# Patient Record
Sex: Male | Born: 1965 | Race: White | Hispanic: No | Marital: Married | Smoking: Never smoker
Health system: Southern US, Community
[De-identification: ages and names within clinical notes are randomized; demographics above are authoritative.]

## PROBLEM LIST (undated history)

## (undated) DIAGNOSIS — Z8679 Personal history of other diseases of the circulatory system: Secondary | ICD-10-CM

## (undated) DIAGNOSIS — K219 Gastro-esophageal reflux disease without esophagitis: Secondary | ICD-10-CM

## (undated) DIAGNOSIS — E782 Mixed hyperlipidemia: Secondary | ICD-10-CM

## (undated) DIAGNOSIS — G44219 Episodic tension-type headache, not intractable: Secondary | ICD-10-CM

## (undated) DIAGNOSIS — I10 Essential (primary) hypertension: Secondary | ICD-10-CM

## (undated) HISTORY — DX: Essential (primary) hypertension: I10

## (undated) HISTORY — DX: Gastro-esophageal reflux disease without esophagitis: K21.9

## (undated) HISTORY — DX: Personal history of other diseases of the circulatory system: Z86.79

## (undated) HISTORY — DX: Episodic tension-type headache, not intractable: G44.219

## (undated) HISTORY — DX: Mixed hyperlipidemia: E78.2

---

## 2011-01-17 DIAGNOSIS — I10 Essential (primary) hypertension: Secondary | ICD-10-CM | POA: Insufficient documentation

## 2016-11-09 DIAGNOSIS — H6982 Other specified disorders of Eustachian tube, left ear: Secondary | ICD-10-CM | POA: Insufficient documentation

## 2017-01-16 DIAGNOSIS — I4510 Unspecified right bundle-branch block: Secondary | ICD-10-CM

## 2017-01-16 HISTORY — DX: Unspecified right bundle-branch block: I45.10

## 2017-02-16 DIAGNOSIS — R42 Dizziness and giddiness: Secondary | ICD-10-CM | POA: Insufficient documentation

## 2017-04-25 DIAGNOSIS — K219 Gastro-esophageal reflux disease without esophagitis: Secondary | ICD-10-CM | POA: Insufficient documentation

## 2017-04-25 DIAGNOSIS — E782 Mixed hyperlipidemia: Secondary | ICD-10-CM | POA: Insufficient documentation

## 2017-06-20 DIAGNOSIS — I2510 Atherosclerotic heart disease of native coronary artery without angina pectoris: Secondary | ICD-10-CM | POA: Insufficient documentation

## 2017-06-22 DIAGNOSIS — Z789 Other specified health status: Secondary | ICD-10-CM | POA: Insufficient documentation

## 2017-06-26 DIAGNOSIS — I4510 Unspecified right bundle-branch block: Secondary | ICD-10-CM | POA: Insufficient documentation

## 2017-07-31 ENCOUNTER — Encounter: Payer: Self-pay | Admitting: Neurology

## 2017-09-26 NOTE — Progress Notes (Signed)
NEUROLOGY CONSULTATION NOTE  Barry Villarreal MRN: [id number] DOB: 1965-09-21  Referring provider: Tawanna Cooler, MD Primary care provider: No PCP  Reason for consult:  headaches  HISTORY OF PRESENT ILLNESS: Barry Villarreal is a 52 year old right-handed male who presents for headaches.  History supplemented by referring provider's note.  Onset of symptoms started in September 2018.  He was playing golf and while walking to his ball, he suddenly felt like he was going to pass out.  On average, it lasts for a brief moment and occurs on average 1 to 2 days a week.    He also has a headache starting in the back of his neck radiating to the occipital region, rarely to the front, moderate to severe intensity, non-throbbing squeezing.  They last all day and are daily.    They are not associated with nausea, vomiting, photophobia, phonophobia, osmophobia, autonomic symptoms or unilateral numbness or weakness.  They are not aggravating by anything.  Aleve or Excedrin Tension sometimes helps.  He takes Aleve daily.  He also reports episodes of left facial numbness and tingling associated with slowed speech.  It is not associated with headache, nausea, vomiting, photophobia, phonophobia, osmophobia, visual disturbance, autonomic symptoms or unilateral numbness or weakness  It typically lasts less than 30 minutes.  It typically occurs once a week.    He denies shooting pain down the left arm, but feels his left arm may be weak.  He denies history of headaches and migraines.   Caffeine:  2 cups coffee daily.  For a period of time, he stopped and nothing changed. Exercise:  routine Depression:  No; Anxiety:  No Sleep:  Good. No family history of headaches or other neurologic conditions.   PAST MEDICAL HISTORY: No past medical history on file.  PAST SURGICAL HISTORY: No past surgical history of file.  MEDICATIONS: No current outpatient medications on file prior to visit.   No current  facility-administered medications on file prior to visit.     ALLERGIES: Advil PCN Sulfa  FAMILY HISTORY: No family history on file.  SOCIAL HISTORY: Social History   Socioeconomic History  . Marital status: Married    Spouse name: Not on file  . Number of children: Not on file  . Years of education: Not on file  . Highest education level: Not on file  Occupational History  . Not on file  Social Needs  . Financial resource strain: Not on file  . Food insecurity:    Worry: Not on file    Inability: Not on file  . Transportation needs:    Medical: Not on file    Non-medical: Not on file  Tobacco Use  . Smoking status: Not on file  Substance and Sexual Activity  . Alcohol use: Not on file  . Drug use: Not on file  . Sexual activity: Not on file  Lifestyle  . Physical activity:    Days per week: Not on file    Minutes per session: Not on file  . Stress: Not on file  Relationships  . Social connections:    Talks on phone: Not on file    Gets together: Not on file    Attends religious service: Not on file    Active member of club or organization: Not on file    Attends meetings of clubs or organizations: Not on file    Relationship status: Not on file  . Intimate partner violence:    Fear of current or ex  partner: Not on file    Emotionally abused: Not on file    Physically abused: Not on file    Forced sexual activity: Not on file  Other Topics Concern  . Not on file  Social History Narrative  . Not on file    REVIEW OF SYSTEMS: Constitutional: No fevers, chills, or sweats, no generalized fatigue, change in appetite Eyes: No visual changes, double vision, eye pain Ear, nose and throat: No hearing loss, ear pain, nasal congestion, sore throat Cardiovascular: No chest pain, palpitations Respiratory:  No shortness of breath at rest or with exertion, wheezes GastrointestinaI: No nausea, vomiting, diarrhea, abdominal pain, fecal incontinence Genitourinary:  No  dysuria, urinary retention or frequency Musculoskeletal:  No neck pain, back pain Integumentary: No rash, pruritus, skin lesions Neurological: as above Psychiatric: No depression, insomnia, anxiety Endocrine: No palpitations, fatigue, diaphoresis, mood swings, change in appetite, change in weight, increased thirst Hematologic/Lymphatic:  No purpura, petechiae. Allergic/Immunologic: no itchy/runny eyes, nasal congestion, recent allergic reactions, rashes  PHYSICAL EXAM: Blood pressure 112/74, pulse 67, height 5\' 10"  (1.778 m), weight 208 lb (94.3 kg), SpO2 99 %. General: No acute distress.  Patient appears well-groomed.   Head:  Normocephalic/atraumatic Eyes:  fundi examined but not visualized Neck: supple, left sided tenderness, full range of motion Back: No paraspinal tenderness Heart: regular rate and rhythm Lungs: Clear to auscultation bilaterally. Vascular: No carotid bruits. Neurological Exam: Mental status: alert and oriented to person, place, and time, recent and remote memory intact, fund of knowledge intact, attention and concentration intact, speech fluent and not dysarthric, language intact. Cranial nerves: CN I: not tested CN II: pupils equal, round and reactive to light, visual fields intact CN III, IV, VI:  full range of motion, no nystagmus, no ptosis CN V: facial sensation intact CN VII: upper and lower face symmetric CN VIII: hearing intact CN IX, X: gag intact, uvula midline CN XI: sternocleidomastoid and trapezius muscles intact CN XII: tongue midline Bulk & Tone: normal, no fasciculations. Motor:  5/5 throughout  Sensation:  Pinprick and vibration sensation intact. Deep Tendon Reflexes:  2+ throughout, toes downgoing.   Finger to nose testing:  Without dysmetria.  Heel to shin:  Without dysmetria.   Gait:  Normal station and stride.  Able to turn and tandem walk. Romberg negative  IMPRESSION: 1.  New onset headaches.  Semiology of headaches appear  consistent with tension-type headache complicated by medication-overuse.  I have no immediate explanation for the intermittent left facial numbness with slowed speech.  It could be migraine, which may suggest the headaches are migraine as well.  I do not suspect seizure.  She reports subjective left arm weakness, not appreciated on my exam.   2.  Near-syncope.  Unclear etiology.  At this point, it does not appear to be neurologic.  Cardiac workup reportedly negative.  He has been diagnosed with eustachian tube dysfunction but that wouldn't cause sensation of going to pass out.  PLAN: 1.  We will start nortriptyline 10mg  at bedtime.  If headaches not improved in 4 weeks, he may contact us and we can increase dose to 25mg  at bedtime 2.  Limit use of pain relievers to no more than 2 days out of week to prevent risk of rebound or medication-overuse headache. 3.  Keep headache diary 4.  Once you know that you are tolerating the nortriptyline (not causing too much drowsiness the next day), then try taking tizanidine 2mg  at bedtime to help with neck stiffness. 5.  We will obtain MRI brain report from your previous neurologist.  In the meantime, try to get a copy of the imaging on CD and bring it to me so that I may review it. 6.  Follow up in 3 to 4 months but remember to contact me in 4 weeks if you think we need to adjust the dose of medication.  Thank you for allowing me to take part in the care of this patient.  45 minutes spent face to face with patient, over 50% spent discussing diagnosis and management.  Shon Millet, DO  CC: Tawanna Cooler, MD

## 2017-09-27 ENCOUNTER — Encounter

## 2017-09-27 ENCOUNTER — Ambulatory Visit: Payer: 59 | Admitting: Neurology

## 2017-09-27 ENCOUNTER — Encounter: Payer: Self-pay | Admitting: Neurology

## 2017-09-27 VITALS — BP 112/74 | HR 67 | Ht 70.0 in | Wt 208.0 lb

## 2017-09-27 DIAGNOSIS — G44229 Chronic tension-type headache, not intractable: Secondary | ICD-10-CM | POA: Diagnosis not present

## 2017-09-27 DIAGNOSIS — R200 Anesthesia of skin: Secondary | ICD-10-CM

## 2017-09-27 DIAGNOSIS — R55 Syncope and collapse: Secondary | ICD-10-CM | POA: Diagnosis not present

## 2017-09-27 MED ORDER — NORTRIPTYLINE HCL 10 MG PO CAPS: 10.0000 mg | ORAL_CAPSULE | Freq: Every day | ORAL | 3 refills | Status: DC

## 2017-09-27 MED ORDER — TIZANIDINE HCL 2 MG PO TABS: 2.0000 mg | ORAL_TABLET | Freq: Every day | ORAL | 3 refills | Status: DC

## 2017-09-27 NOTE — Patient Instructions (Addendum)
The symptoms may be explained by migraines.  We will first treat for headaches/migraines and see if symptoms improve. 1.  Start nortriptyline 10mg  at bedtime.  If headaches not improved in 4 weeks, contact me and we can increase dose. 2.  Once you know that you are tolerating the nortriptyline (not causing too much drowsiness the next day), then try taking tizanidine 2mg  at bedtime to help with neck stiffness. 3.  Limit use of Aleve or Excedrin to no more than 2 days out of week to prevent risk of rebound or medication-overuse headache. 4.  Keep headache diary 5.  We will obtain MRI brain report from your previous neurologist.  In the meantime, try to get a copy of the imaging on CD and bring it to me so that I may review it. 6.  Follow up in 3 to 4 months but remember to contact me in 4 weeks if you think we need to adjust the dose of medication.

## 2017-10-08 ENCOUNTER — Telehealth: Payer: Self-pay | Admitting: Neurology

## 2017-10-08 NOTE — Telephone Encounter (Signed)
Faxed info

## 2017-10-08 NOTE — Telephone Encounter (Signed)
Dr. Nelida MeuseNicholas Stowell's office called needing this patient's office note 09/27/2017  faxed to them at [phone]. Thanks

## 2018-01-17 NOTE — Progress Notes (Signed)
NEUROLOGY FOLLOW UP OFFICE NOTE  Barry SalesSteven Fuhrer [id number]  HISTORY OF PRESENT ILLNESS: Barry Villarreal is a 53 year old right-handed male who follows up for tension type headache.  UPDATE: Headaches have resolved.  No longer has the facial numbness.  Rarely with dizziness.  Occasional blurred vision.  Still with posterior neck tightness radiating into the shoulders. Current NSAIDS: Aleve Current analgesics: Excedrin Current triptans:  none Current ergotamine:  none Current anti-emetic:  none Current muscle relaxants: Tizanidine 2 mg at bedtime Current anti-anxiolytic:  none Current sleep aide:  none Current Antihypertensive medications:  lisinopril Current Antidepressant medications: Nortriptyline 10 mg at bedtime Current Anticonvulsant medications:  none Current anti-CGRP:  none Current Vitamins/Herbal/Supplements:  D Current Antihistamines/Decongestants:  none Other therapy:  none  Caffeine: 2 cups of coffee daily Diet:  hydrates Exercise: Routine Depression: No; Anxiety: No Other pain: No Sleep hygiene: Good  HISTORY:  Onset of symptoms started in September 2018. He was playing golf and while walking to his ball, he suddenly felt like he was going to pass out.  On average, it lasts for a brief moment and occurs on average 1 to 2 days a week.    He also has a headache starting in the back of his neck radiating to the occipital region, rarely to the front, moderate to severe intensity, non-throbbing squeezing.  They last all day and are daily.      They are not associated with nausea, vomiting, photophobia, phonophobia, osmophobia, autonomic symptoms or unilateral numbness or weakness.    They are not aggravated by anything.  Aleve or Excedrin tension sometimes helps.  He has previously tried massage therapy and seen a chiropractor but uncertain if helpful because he was preoccupied by the headaches at the time.  He also reports episodes of left facial numbness and  tingling associated with slowed speech.  It is not associated with headache, nausea, vomiting, photophobia, phonophobia, osmophobia, visual disturbance, autonomic symptoms or unilateral numbness or weakness  It typically lasts less than 30 minutes.  It typically occurs once a week.    He denies shooting pain down the left arm, but feels his left arm may be weak.  He denies history of headaches and migraines.   Caffeine:  2 cups coffee daily.  For a period of time, he stopped and nothing changed. Exercise:  routine Depression:  No; Anxiety:  No Sleep:  Good. No family history of headaches or other neurologic conditions.  PAST MEDICAL HISTORY: Hypertension Hyperlipidemia  MEDICATIONS: Current Outpatient Medications on File Prior to Visit  Medication Sig Dispense Refill  . cholecalciferol (VITAMIN D) 1000 units tablet Take 1,000 Units by mouth daily.    Marland Kitchen. lisinopril (PRINIVIL,ZESTRIL) 20 MG tablet Take 20 mg by mouth daily.  3  . nortriptyline (PAMELOR) 10 MG capsule Take 1 capsule (10 mg total) by mouth at bedtime. 30 capsule 3  . REPATHA 140 MG/ML SOSY Inject 140 mg into the skin every 30 (thirty) days.    Marland Kitchen. tiZANidine (ZANAFLEX) 2 MG tablet Take 1 tablet (2 mg total) by mouth at bedtime. 30 tablet 3   No current facility-administered medications on file prior to visit.     ALLERGIES: Not on File  FAMILY HISTORY: Family History  Problem Relation Age of Onset  . Bipolar disorder Mother   . Breast cancer Mother    SOCIAL HISTORY: Social History   Socioeconomic History  . Marital status: Married    Spouse name: angie  . Number of children: Not on file  .  Years of education: Not on file  . Highest education level: Associate degree: academic program  Occupational History  . Occupation: Investment banker, corporate: [employer]  Social Needs  . Financial resource strain: Not on file  . Food insecurity:    Worry: Not on file    Inability: Not on file  . Transportation  needs:    Medical: Not on file    Non-medical: Not on file  Tobacco Use  . Smoking status: Never Smoker  . Smokeless tobacco: Never Used  Substance and Sexual Activity  . Alcohol use: Yes    Comment: social  . Drug use: Never  . Sexual activity: Not on file  Lifestyle  . Physical activity:    Days per week: Not on file    Minutes per session: Not on file  . Stress: Not on file  Relationships  . Social connections:    Talks on phone: Not on file    Gets together: Not on file    Attends religious service: Not on file    Active member of club or organization: Not on file    Attends meetings of clubs or organizations: Not on file    Relationship status: Not on file  . Intimate partner violence:    Fear of current or ex partner: Not on file    Emotionally abused: Not on file    Physically abused: Not on file    Forced sexual activity: Not on file  Other Topics Concern  . Not on file  Social History Narrative   Patient is right-handed. He lives with his wife in a 2 story house. He drinks 1-2 cups of coffee a day, and rarely tea or soda.    REVIEW OF SYSTEMS: Constitutional: No fevers, chills, or sweats, no generalized fatigue, change in appetite Eyes: No visual changes, double vision, eye pain Ear, nose and throat: No hearing loss, ear pain, nasal congestion, sore throat Cardiovascular: No chest pain, palpitations Respiratory:  No shortness of breath at rest or with exertion, wheezes GastrointestinaI: No nausea, vomiting, diarrhea, abdominal pain, fecal incontinence Genitourinary:  No dysuria, urinary retention or frequency Musculoskeletal:  No neck pain, back pain Integumentary: No rash, pruritus, skin lesions Neurological: as above Psychiatric: No depression, insomnia, anxiety Endocrine: No palpitations, fatigue, diaphoresis, mood swings, change in appetite, change in weight, increased thirst Hematologic/Lymphatic:  No purpura, petechiae. Allergic/Immunologic: no  itchy/runny eyes, nasal congestion, recent allergic reactions, rashes  PHYSICAL EXAM: Blood pressure 122/68, pulse 86, height 5\' 10"  (1.778 m), weight 210 lb (95.3 kg), SpO2 97 %. General: No acute distress.  Patient appears well-groomed.   Head:  Normocephalic/atraumatic Eyes:  Fundi examined but not visualized Neck: supple, no paraspinal tenderness, full range of motion Back: No paraspinal tenderness Neurological Exam: alert and oriented to person, place, and time. Attention span and concentration intact, recent and remote memory intact, fund of knowledge intact.  Speech fluent and not dysarthric, language intact.  CN II-XII intact. Bulk and tone normal, muscle strength 5/5 throughout.  Sensation to light touch intact.  Deep tendon reflexes 2+ throughout, toes downgoing.  Finger to nose and heel to shin testing intact.  Gait normal, Romberg negative.  IMPRESSION: 1.  Cervicalgia, myofascial 2.  Tension-type headache, resolved 3.  Recurrent transient spells presenting as left facial numbness and slurred speech, resolved.  Differential diagnosis may include complicated migraine without headache.  PLAN: 1.  For preventative management, continue nortriptyline 10mg  at bedtime 2. For myofascial neck pain, increase  tizanidine to 4mg  at bedtime.  If ineffective, consider referral to physical therapy 3.  Limit use of pain relievers to no more than 2 days out of week to prevent risk of rebound or medication-overuse headache. 4.  Keep headache diary 5.  Exercise, hydration, caffeine cessation, sleep hygiene, monitor for and avoid triggers 6.  Consider:  magnesium citrate 400mg  daily, riboflavin 400mg  daily, and coenzyme Q10 100mg  three times daily 7.  Follow up in 6 months.  Shon MilletAdam Jaffe, DO

## 2018-01-18 ENCOUNTER — Ambulatory Visit: Payer: 59 | Admitting: Neurology

## 2018-01-18 ENCOUNTER — Encounter: Payer: Self-pay | Admitting: Neurology

## 2018-01-18 VITALS — BP 122/68 | HR 86 | Ht 70.0 in | Wt 210.0 lb

## 2018-01-18 DIAGNOSIS — R200 Anesthesia of skin: Secondary | ICD-10-CM | POA: Diagnosis not present

## 2018-01-18 DIAGNOSIS — M542 Cervicalgia: Secondary | ICD-10-CM

## 2018-01-18 DIAGNOSIS — G44229 Chronic tension-type headache, not intractable: Secondary | ICD-10-CM

## 2018-01-18 MED ORDER — TIZANIDINE HCL 4 MG PO TABS: 4.0000 mg | ORAL_TABLET | Freq: Every day | ORAL | 5 refills | Status: DC

## 2018-01-18 NOTE — Patient Instructions (Signed)
1.  Increase tizanidine to 4mg  at bedtime 2.  Continue nortriptyline 10mg  at bedtime. 3.  If neck tension continues, contact me and I can refer you to a sports medicine doctor 4.  Follow up in 6 months.

## 2018-01-20 ENCOUNTER — Other Ambulatory Visit: Payer: Self-pay | Admitting: Neurology

## 2018-07-10 ENCOUNTER — Other Ambulatory Visit: Payer: Self-pay | Admitting: Neurology

## 2018-07-21 ENCOUNTER — Other Ambulatory Visit: Payer: Self-pay | Admitting: Neurology

## 2018-07-31 NOTE — Progress Notes (Signed)
Virtual Visit via Video Note The purpose of this virtual visit is to provide medical care while limiting exposure to the novel coronavirus.    Consent was obtained for video visit:  Yes.   Answered questions that patient had about telehealth interaction:  Yes.   I discussed the limitations, risks, security and privacy concerns of performing an evaluation and management service by telemedicine. I also discussed with the patient that there may be a patient responsible charge related to this service. The patient expressed understanding and agreed to proceed.  Pt location: Home Physician Location: office Name of referring provider:  No ref. provider found I connected with Barry Villarreal at patients initiation/request on 08/01/2018 at  3:30 PM EDT by video enabled telemedicine application and verified that I am speaking with the correct person using two identifiers. Pt MRN:  [id number] Pt DOB:  1965/09/20 Video Participants:  Barry Villarreal   History of Present Illness:  Barry Villarreal is a 53 year old right-handed male who follows up for tension type headache.  UPDATE: Dull-moderate headaches last all day but may get one no more than once or twice a week.  No longer has the facial numbness.  Rarely with dizziness, particularly if he gets up to walk, bends over or turns quickly.  Occasional blurred vision.  Still with posterior neck tightness radiating into the shoulders. Current NSAIDS: Aleve Current analgesics: Excedrin Current triptans:  none Current ergotamine:  none Current anti-emetic:  none Current muscle relaxants: Tizanidine 4 mg in evening Current anti-anxiolytic:  none Current sleep aide:  none Current Antihypertensive medications:  lisinopril Current Antidepressant medications: Nortriptyline 10 mg at bedtime Current Anticonvulsant medications:  none Current anti-CGRP:  none Current Vitamins/Herbal/Supplements:  D Current Antihistamines/Decongestants:  none Other  therapy:  none  Caffeine: 2 cups of coffee daily Diet:  hydrates Exercise: Routine Depression: No; Anxiety: No Other pain: No Sleep hygiene: Good  HISTORY:  Onset of symptoms started in September 2018.He was playing golf and while walking to his ball, he suddenly felt like he was going to pass out. On average, it lasts for a brief moment and occurs on average 1 to 2 days a week.   He also has a headache starting in the back of his neck radiating to the occipital region, rarely to the front, moderate to severe intensity, non-throbbing squeezing. They last all day and are daily.   They are not associated with nausea, vomiting, photophobia, phonophobia, osmophobia, autonomic symptoms or unilateral numbness or weakness.   They are not aggravated by anything.  Aleve or Excedrin tension sometimes helps.  He has previously tried massage therapy and seen a chiropractor but uncertain if helpful because he was preoccupied by the headaches at the time.  He also reports episodes of left facial numbness and tinglingassociated with slowed speech. It is notassociated with headache, nausea, vomiting, photophobia, phonophobia, osmophobia, visual disturbance, autonomic symptoms or unilateral numbness or weaknessIt typically lasts less than 30 minutes. It typically occurs once a week.   He denies shooting pain down the left arm, but feels his left arm may be weak.  He denies history of headaches and migraines.  Caffeine: 2 cups coffee daily. For a period of time, he stopped and nothing changed. Exercise: routine Depression: No; Anxiety: No Sleep: Good. No family history of headaches or other neurologic conditions.  Past Medical History: No past medical history on file.  Medications: Outpatient Encounter Medications as of 08/01/2018  Medication Sig  . cholecalciferol (VITAMIN D) 1000 units tablet Take  1,000 Units by mouth daily.  Marland Kitchen. lisinopril (PRINIVIL,ZESTRIL) 20 MG tablet  Take 20 mg by mouth daily.  . nortriptyline (PAMELOR) 10 MG capsule TAKE 1 CAPSULE(10 MG) BY MOUTH AT BEDTIME  . REPATHA 140 MG/ML SOSY Inject 140 mg into the skin every 30 (thirty) days.  Marland Kitchen. tiZANidine (ZANAFLEX) 4 MG tablet TAKE 1 TABLET(4 MG) BY MOUTH AT BEDTIME   No facility-administered encounter medications on file as of 08/01/2018.     Allergies: Not on File  Family History: Family History  Problem Relation Age of Onset  . Bipolar disorder Mother   . Breast cancer Mother     Social History: Social History   Socioeconomic History  . Marital status: Married    Spouse name: angie  . Number of children: Not on file  . Years of education: Not on file  . Highest education level: Associate degree: academic program  Occupational History  . Occupation: Investment banker, corporateales    Employer: [employer]  Social Needs  . Financial resource strain: Not on file  . Food insecurity    Worry: Not on file    Inability: Not on file  . Transportation needs    Medical: Not on file    Non-medical: Not on file  Tobacco Use  . Smoking status: Never Smoker  . Smokeless tobacco: Never Used  Substance and Sexual Activity  . Alcohol use: Yes    Comment: social  . Drug use: Never  . Sexual activity: Not on file  Lifestyle  . Physical activity    Days per week: Not on file    Minutes per session: Not on file  . Stress: Not on file  Relationships  . Social Musicianconnections    Talks on phone: Not on file    Gets together: Not on file    Attends religious service: Not on file    Active member of club or organization: Not on file    Attends meetings of clubs or organizations: Not on file    Relationship status: Not on file  . Intimate partner violence    Fear of current or ex partner: Not on file    Emotionally abused: Not on file    Physically abused: Not on file    Forced sexual activity: Not on file  Other Topics Concern  . Not on file  Social History Narrative   Patient is right-handed. He  lives with his wife in a 2 story house. He drinks 1-2 cups of coffee a day, and rarely tea or soda.    Observations/Objective:   Height 5\' 11"  (1.803 m), weight 200 lb (90.7 kg). No acute distress.  Alert and oriented.  Speech fluent and not dysarthric.  Language intact.  Eyes orthophoric on primary gaze.  Face symmetric.  Assessment and Plan:   1.  Cervicalgia, myofascial 2.  Tension-type headache, resolved 3.  Recurrent transient spells presenting as left facial numbness and slurred speech, resolved.  Differential diagnosis may include complicated migraine without headache.  1.  For preventative management, increase nortriptyline to 25mg  at bedtime 2.  For cervicalgia, will refer to Sports Medicine for [facility].  Continue tizanidine 4mg  in the evening. 3.  Limit use of pain relievers to no more than 2 days out of week to prevent risk of rebound or medication-overuse headache. 4.  Keep headache diary 5.  Exercise, hydration, caffeine cessation, sleep hygiene, monitor for and avoid triggers 6.  Consider:  magnesium citrate 400mg  daily, riboflavin 400mg  daily, and coenzyme Q10  100mg  three times daily 7. Always keep in mind that currently taking a hormone or birth control may be a possible trigger or aggravating factor for migraine. 8. Follow up in 6 months.  Follow Up Instructions:    -I discussed the assessment and treatment plan with the patient. The patient was provided an opportunity to ask questions and all were answered. The patient agreed with the plan and demonstrated an understanding of the instructions.   The patient was advised to call back or seek an in-person evaluation if the symptoms worsen or if the condition fails to improve as anticipated.   Cira ServantAdam Robert Andreanna Mikolajczak, DO

## 2018-08-01 ENCOUNTER — Other Ambulatory Visit: Payer: Self-pay

## 2018-08-01 ENCOUNTER — Telehealth (INDEPENDENT_AMBULATORY_CARE_PROVIDER_SITE_OTHER): Payer: 59 | Admitting: Neurology

## 2018-08-01 ENCOUNTER — Encounter: Payer: Self-pay | Admitting: Neurology

## 2018-08-01 VITALS — Ht 71.0 in | Wt 200.0 lb

## 2018-08-01 DIAGNOSIS — G44219 Episodic tension-type headache, not intractable: Secondary | ICD-10-CM

## 2018-08-01 DIAGNOSIS — M542 Cervicalgia: Secondary | ICD-10-CM

## 2018-08-01 MED ORDER — NORTRIPTYLINE HCL 25 MG PO CAPS: 25.0000 mg | ORAL_CAPSULE | Freq: Every day | ORAL | 3 refills | Status: DC

## 2018-08-01 NOTE — Addendum Note (Signed)
Addended by: Clois Comber on: 08/01/2018 03:50 PM   Modules accepted: Orders

## 2018-10-17 DIAGNOSIS — Z8616 Personal history of COVID-19: Secondary | ICD-10-CM

## 2018-10-17 HISTORY — DX: Personal history of COVID-19: Z86.16

## 2018-11-24 ENCOUNTER — Other Ambulatory Visit: Payer: Self-pay | Admitting: Neurology

## 2019-01-16 ENCOUNTER — Other Ambulatory Visit: Payer: Self-pay | Admitting: Neurology

## 2019-02-03 NOTE — Progress Notes (Signed)
Virtual Visit via Video Note The purpose of this virtual visit is to provide medical care while limiting exposure to the novel coronavirus.    Consent was obtained for video visit:  Yes.   Answered questions that patient had about telehealth interaction:  Yes.   I discussed the limitations, risks, security and privacy concerns of performing an evaluation and management service by telemedicine. I also discussed with the patient that there may be a patient responsible charge related to this service. The patient expressed understanding and agreed to proceed.  Pt location: Home Physician Location: office Name of referring provider:  No ref. provider found I connected with Barry Villarreal at patients initiation/request on 02/04/2019 at  3:30 PM EST by video enabled telemedicine application and verified that I am speaking with the correct person using two identifiers. Pt MRN:  [id number] Pt DOB:  17-Mar-1965 Video Participants:  Barry Villarreal   History of Present Illness:  Barry Villarreal is a 54 year old right-handed male who follows up for tension type headache.  UPDATE: Nortriptyline was increased to 25mg  in July.  He has not had any headaches in a few months.  He had Covid in October.  For the past 3 months, he and his wife noted some changes.  He sometimes gets words mixed up.  For example, he may say hot when he meant to say cold.  Also, if he has 3 or 4 paragraphs to read, he has now found himself reading the last paragraph and then working up to the first paragraph.  He does not know why.  He doesn't have any difficulty understanding what he reads or what other people are saying to him.  People do not have difficulty understanding what he is saying.  Current NSAIDS:Aleve Current analgesics:Excedrin Current triptans:none Current ergotamine:none Current anti-emetic:none Current muscle relaxants:Tizanidine 4 mg in evening Current anti-anxiolytic:none Current sleep  aide:none Current Antihypertensive medications:lisinopril Current Antidepressant medications:Nortriptyline 25 mg at bedtime Current Anticonvulsant medications:none Current anti-CGRP:none Current Vitamins/Herbal/Supplements:D Current Antihistamines/Decongestants:none Other therapy:none  Caffeine:2 cups of coffee daily Diet:hydrates Exercise:Routine Depression:No; Anxiety:No Other pain:No Sleep hygiene:Good  HISTORY: Onset of symptoms started in September 2018.He was playing golf and while walking to his ball, he suddenly felt like he was going to pass out. On average, it lasts for a brief moment and occurs on average 1 to 2 days a week.   He also has a headache starting in the back of his neck radiating to the occipital region, rarely to the front, moderate to severe intensity, non-throbbing squeezing. They last all day and are daily.They are not associated with nausea, vomiting, photophobia, phonophobia, osmophobia, autonomic symptoms or unilateral numbness or weakness.They are not aggravated by anything. Aleve or Excedrin tension sometimes helps. He has previously tried massage therapy and seen a chiropractor but uncertain if helpful because he was preoccupied by the headaches at the time.  He also reports episodes of left facial numbness and tinglingassociated with slowed speech. It is notassociated with headache, nausea, vomiting, photophobia, phonophobia, osmophobia, visual disturbance, autonomic symptoms or unilateral numbness or weaknessIt typically lasts less than 30 minutes. It typically occurs once a week.   He denies shooting pain down the left arm, but feels his left arm may be weak.  He denies history of headaches and migraines.  Caffeine: 2 cups coffee daily. For a period of time, he stopped and nothing changed. Exercise: routine Depression: No; Anxiety: No Sleep: Good. No family history of headaches or other  neurologic conditions.  Past Medical History: No  past medical history on file.  Medications: Outpatient Encounter Medications as of 02/04/2019  Medication Sig  . cholecalciferol (VITAMIN D) 1000 units tablet Take 1,000 Units by mouth daily.  . fluticasone (FLONASE) 50 MCG/ACT nasal spray   . LIPITOR 40 MG tablet Take 40 mg by mouth once a week.  Marland Kitchen lisinopril (PRINIVIL,ZESTRIL) 20 MG tablet Take 20 mg by mouth daily.  . nortriptyline (PAMELOR) 25 MG capsule TAKE 1 CAPSULE(25 MG) BY MOUTH AT BEDTIME  . omeprazole (PRILOSEC) 20 MG capsule Take 20 mg by mouth daily.  Marland Kitchen REPATHA 140 MG/ML SOSY Inject 140 mg into the skin every 30 (thirty) days.  Marland Kitchen tiZANidine (ZANAFLEX) 4 MG tablet TAKE 1 TABLET(4 MG) BY MOUTH AT BEDTIME   No facility-administered encounter medications on file as of 02/04/2019.    Allergies: Not on File  Family History: Family History  Problem Relation Age of Onset  . Bipolar disorder Mother   . Breast cancer Mother     Social History: Social History   Socioeconomic History  . Marital status: Married    Spouse name: Barry Villarreal  . Number of children: Not on file  . Years of education: Not on file  . Highest education level: Associate degree: academic program  Occupational History  . Occupation: Investment banker, corporate: [employer]  Tobacco Use  . Smoking status: Never Smoker  . Smokeless tobacco: Never Used  Substance and Sexual Activity  . Alcohol use: Yes    Comment: social  . Drug use: Never  . Sexual activity: Not on file  Other Topics Concern  . Not on file  Social History Narrative   Patient is right-handed. He lives with his wife in a 2 story house. He drinks 1-2 cups of coffee a day, and rarely tea or soda.   Social Determinants of Health   Financial Resource Strain:   . Difficulty of Paying Living Expenses: Not on file  Food Insecurity:   . Worried About Programme researcher, broadcasting/film/video in the Last Year: Not on file  . Ran Out of Food in the Last Year:  Not on file  Transportation Needs:   . Lack of Transportation (Medical): Not on file  . Lack of Transportation (Non-Medical): Not on file  Physical Activity:   . Days of Exercise per Week: Not on file  . Minutes of Exercise per Session: Not on file  Stress:   . Feeling of Stress : Not on file  Social Connections:   . Frequency of Communication with Friends and Family: Not on file  . Frequency of Social Gatherings with Friends and Family: Not on file  . Attends Religious Services: Not on file  . Active Member of Clubs or Organizations: Not on file  . Attends Banker Meetings: Not on file  . Marital Status: Not on file  Intimate Partner Violence:   . Fear of Current or Ex-Partner: Not on file  . Emotionally Abused: Not on file  . Physically Abused: Not on file  . Sexually Abused: Not on file    Observations/Objective:   Height 5\' 10"  (1.778 m), weight 200 lb (90.7 kg). No acute distress.  Alert and oriented.  Speech fluent and not dysarthric.  Language intact.  Eyes orthophoric on primary gaze.  Face symmetric.  Assessment and Plan:   1.  Paraphasic errors.  Unclear etiology.  Will further evaluate 2.  Tension-type headache, resolved 3.  Recurrent transient spells of left facial numbness and slurred speech, resolved.  Differential include complicated migraine without headache.  1.  To evaluate for language disorder, will check MRI of brain without contrast and neuropsychological testing. 2.  Continue nortriptyline 25mg  at bedtime. 3.  Follow up in 6 months (or sooner pending test results)  Follow Up Instructions:    -I discussed the assessment and treatment plan with the patient. The patient was provided an opportunity to ask questions and all were answered. The patient agreed with the plan and demonstrated an understanding of the instructions.   The patient was advised to call back or seek an in-person evaluation if the symptoms worsen or if the condition fails to  improve as anticipated.    Dudley Major, DO

## 2019-02-04 ENCOUNTER — Other Ambulatory Visit: Payer: Self-pay

## 2019-02-04 ENCOUNTER — Encounter: Payer: Self-pay | Admitting: Neurology

## 2019-02-04 ENCOUNTER — Telehealth (INDEPENDENT_AMBULATORY_CARE_PROVIDER_SITE_OTHER): Payer: 59 | Admitting: Neurology

## 2019-02-04 VITALS — Ht 70.0 in | Wt 200.0 lb

## 2019-02-04 DIAGNOSIS — F809 Developmental disorder of speech and language, unspecified: Secondary | ICD-10-CM

## 2019-02-04 DIAGNOSIS — R200 Anesthesia of skin: Secondary | ICD-10-CM

## 2019-02-04 DIAGNOSIS — G44219 Episodic tension-type headache, not intractable: Secondary | ICD-10-CM

## 2019-03-05 ENCOUNTER — Other Ambulatory Visit: Payer: Self-pay

## 2019-03-05 ENCOUNTER — Ambulatory Visit
Admission: RE | Admit: 2019-03-05 | Discharge: 2019-03-05 | Disposition: A | Discharge status: Home / Self Care | Payer: 59 | Source: Ambulatory Visit | Attending: Neurology | Admitting: Neurology

## 2019-03-05 DIAGNOSIS — R200 Anesthesia of skin: Secondary | ICD-10-CM

## 2019-03-05 DIAGNOSIS — F809 Developmental disorder of speech and language, unspecified: Secondary | ICD-10-CM

## 2019-04-19 ENCOUNTER — Other Ambulatory Visit: Payer: Self-pay | Admitting: Neurology

## 2019-07-07 ENCOUNTER — Ambulatory Visit: Payer: 59 | Admitting: Neurology

## 2019-07-08 ENCOUNTER — Encounter: Payer: 59 | Admitting: Psychology

## 2019-07-09 ENCOUNTER — Other Ambulatory Visit: Payer: Self-pay | Admitting: Neurology

## 2019-07-14 NOTE — Progress Notes (Signed)
NEUROLOGY FOLLOW UP OFFICE NOTE  Barry Villarreal [id number]  HISTORY OF PRESENT ILLNESS: Barry Villarreal is a 54 year old right-handed male who follows up for tension-type headache.  UPDATE: To evaluate for cause of language disorder, MRI of brain without contrast on 03/05/2019 was performed, which was normal.   Headaches improved.  Sometimes with tension in back of neck but not a headache.    When he gets up out of bed, he feels slightly dizzy and off balance for a couple of seconds.  Loses focus on what he wants to say.   Current NSAIDS:Aleve Current analgesics:Excedrin Current triptans:none Current ergotamine:none Current anti-emetic:none Current muscle relaxants:Tizanidine4mg  in evening Current anti-anxiolytic:none Current sleep aide:none Current Antihypertensive medications:lisinopril Current Antidepressant medications:Nortriptyline 25 mg at bedtime Current Anticonvulsant medications:none Current anti-CGRP:none Current Vitamins/Herbal/Supplements:D Current Antihistamines/Decongestants:none Other therapy:none  Caffeine:2 cups of coffee daily Diet:hydrates Exercise:Routine Depression:No; Anxiety:No Other pain:No Sleep hygiene:Good  HISTORY: Onset of symptoms started in September 2018.He was playing golf and while walking to his ball, he suddenly felt like he was going to pass out. On average, it lasts for a brief moment and occurs on average 1 to 2 days a week.   He also has a headache starting in the back of his neck radiating to the occipital region, rarely to the front, moderate to severe intensity, non-throbbing squeezing. They last all day and are daily.They are not associated with nausea, vomiting, photophobia, phonophobia, osmophobia, autonomic symptoms or unilateral numbness or weakness.They are not aggravated by anything. Aleve or Excedrin tension sometimes helps. He has previously tried massage therapy  and seen a chiropractor but uncertain if helpful because he was preoccupied by the headaches at the time.  He also reports episodes of left facial numbness and tinglingassociated with slowed speech. It is notassociated with headache, nausea, vomiting, photophobia, phonophobia, osmophobia, visual disturbance, autonomic symptoms or unilateral numbness or weaknessIt typically lasts less than 30 minutes. It typically occurs once a week.   Since 2020, he and his wife noted some changes.  He sometimes gets words mixed up.  For example, he may say hot when he meant to say cold.  Also, if he has 3 or 4 paragraphs to read, he has now found himself reading the last paragraph and then working up to the first paragraph.  He does not know why.  He doesn't have any difficulty understanding what he reads or what other people are saying to him.  People do not have difficulty understanding what he is saying.    He denies shooting pain down the left arm, but feels his left arm may be weak.  He denies history of headaches and migraines.  Caffeine: 2 cups coffee daily. For a period of time, he stopped and nothing changed. Exercise: routine Depression: No; Anxiety: No Sleep: Good. No family history of headaches or other neurologic conditions.  PAST MEDICAL HISTORY: No past medical history on file.  MEDICATIONS: Current Outpatient Medications on File Prior to Visit  Medication Sig Dispense Refill  . cholecalciferol (VITAMIN D) 1000 units tablet Take 1,000 Units by mouth daily.    . fluticasone (FLONASE) 50 MCG/ACT nasal spray     . lisinopril (PRINIVIL,ZESTRIL) 20 MG tablet Take 20 mg by mouth daily.  3  . Multiple Vitamins-Minerals (MULTIVITAMIN WITH MINERALS) tablet Take 1 tablet by mouth daily.    . nortriptyline (PAMELOR) 25 MG capsule TAKE 1 CAPSULE BY MOUTH EVERY NIGHT AT BEDTIME 30 capsule 3  . omeprazole (PRILOSEC) 20 MG capsule Take 20 mg by mouth  daily.    Marland Kitchen REPATHA 140 MG/ML SOSY  Inject 140 mg into the skin every 30 (thirty) days.    Marland Kitchen tiZANidine (ZANAFLEX) 4 MG tablet TAKE 1 TABLET(4 MG) BY MOUTH AT BEDTIME 30 tablet 5   No current facility-administered medications on file prior to visit.    ALLERGIES: Allergies  Allergen Reactions  . Penicillins Hives    FAMILY HISTORY: Family History  Problem Relation Age of Onset  . Bipolar disorder Mother   . Breast cancer Mother     SOCIAL HISTORY: Social History   Socioeconomic History  . Marital status: Married    Spouse name: angie  . Number of children: Not on file  . Years of education: Not on file  . Highest education level: Associate degree: academic program  Occupational History  . Occupation: Investment banker, corporate: [employer]  Tobacco Use  . Smoking status: Never Smoker  . Smokeless tobacco: Never Used  Substance and Sexual Activity  . Alcohol use: Yes    Comment: social  . Drug use: Never  . Sexual activity: Not on file  Other Topics Concern  . Not on file  Social History Narrative   Patient is right-handed. He lives with his wife in a 2 story house. He drinks 1-2 cups of coffee a day, and rarely tea or soda.   Social Determinants of Health   Financial Resource Strain:   . Difficulty of Paying Living Expenses:   Food Insecurity:   . Worried About Programme researcher, broadcasting/film/video in the Last Year:   . Barista in the Last Year:   Transportation Needs:   . Freight forwarder (Medical):   Marland Kitchen Lack of Transportation (Non-Medical):   Physical Activity:   . Days of Exercise per Week:   . Minutes of Exercise per Session:   Stress:   . Feeling of Stress :   Social Connections:   . Frequency of Communication with Friends and Family:   . Frequency of Social Gatherings with Friends and Family:   . Attends Religious Services:   . Active Member of Clubs or Organizations:   . Attends Banker Meetings:   Marland Kitchen Marital Status:   Intimate Partner Violence:   . Fear of Current or  Ex-Partner:   . Emotionally Abused:   Marland Kitchen Physically Abused:   . Sexually Abused:     PHYSICAL EXAM: Blood pressure 130/85, pulse 85, height 5\' 10"  (1.778 m), weight 198 lb 3.2 oz (89.9 kg), SpO2 100 %.  General: No acute distress.  Patient appears well-groomed.   Head:  Normocephalic/atraumatic Eyes:  Fundi examined but not visualized Neck: supple, no paraspinal tenderness, full range of motion Heart:  Regular rate and rhythm Lungs:  Clear to auscultation bilaterally Back: No paraspinal tenderness Neurological Exam: alert and oriented to person, place, and time. Attention span and concentration intact, recent and remote memory intact, fund of knowledge intact.  Speech fluent and not dysarthric, language intact.  CN II-XII intact. Bulk and tone normal, muscle strength 5/5 throughout.  Sensation to light touch, temperature and vibration intact.  Deep tendon reflexes 2+ throughout, toes downgoing.  Finger to nose and heel to shin testing intact.  Gait normal, Romberg negative.  IMPRESSION: 1.  Tension-type headache, not intractable. 2.  Paraphasic errors.  Unclear etiology.   3.  Recurrent transient spells of left facial numbness with slurred speech, resolved.    PLAN: 1.  Continue nortriptyline 2.  Has neuropsych evaluation  on 7/29 3.  Further recommendations pending results.  Otherwise, follow up in 9 months.  Shon Millet, DO

## 2019-07-15 ENCOUNTER — Encounter: Payer: 59 | Admitting: Psychology

## 2019-07-16 ENCOUNTER — Ambulatory Visit: Payer: 59 | Admitting: Neurology

## 2019-07-16 ENCOUNTER — Encounter: Payer: Self-pay | Admitting: Neurology

## 2019-07-16 ENCOUNTER — Other Ambulatory Visit: Payer: Self-pay

## 2019-07-16 VITALS — BP 130/85 | HR 85 | Ht 70.0 in | Wt 198.2 lb

## 2019-07-16 DIAGNOSIS — G44219 Episodic tension-type headache, not intractable: Secondary | ICD-10-CM

## 2019-07-16 DIAGNOSIS — F809 Developmental disorder of speech and language, unspecified: Secondary | ICD-10-CM | POA: Diagnosis not present

## 2019-07-16 NOTE — Patient Instructions (Signed)
Continue nortriptyline Follow up for cognitive testing with Dr. Milbert Coulter on 7/29.  Further recommendations pending results. Otherwise, follow up with me in 9 months.

## 2019-08-11 ENCOUNTER — Other Ambulatory Visit: Payer: Self-pay | Admitting: Neurology

## 2019-08-14 ENCOUNTER — Ambulatory Visit (INDEPENDENT_AMBULATORY_CARE_PROVIDER_SITE_OTHER): Payer: 59 | Admitting: Psychology

## 2019-08-14 ENCOUNTER — Encounter: Payer: Self-pay | Admitting: Psychology

## 2019-08-14 ENCOUNTER — Other Ambulatory Visit: Payer: Self-pay

## 2019-08-14 ENCOUNTER — Ambulatory Visit: Payer: 59 | Admitting: Psychology

## 2019-08-14 DIAGNOSIS — R4189 Other symptoms and signs involving cognitive functions and awareness: Secondary | ICD-10-CM

## 2019-08-14 DIAGNOSIS — G44219 Episodic tension-type headache, not intractable: Secondary | ICD-10-CM

## 2019-08-14 DIAGNOSIS — Z8679 Personal history of other diseases of the circulatory system: Secondary | ICD-10-CM | POA: Insufficient documentation

## 2019-08-14 NOTE — Progress Notes (Signed)
NEUROPSYCHOLOGICAL EVALUATION [place] Department of Neurology  Date of Evaluation: August 14, 2019  Reason for Referral:   Thomasenia SalesSteven Cervantes is a 54 y.o. right-handed Caucasian male referred by Shon MilletAdam Jaffe, D.O., to characterize his current cognitive functioning and assist with diagnostic clarity and treatment planning in the context of subjective cognitive decline primarily surrounding expressive language.   Assessment and Plan:   Clinical Impression(s): Mr. Leda QuailHartsell's pattern of performance is suggestive of neuropsychological functioning largely within normal limits. An isolated weakness was exhibited recalling a previously learned list of words after a lengthy delay (retention percentage: 0%). However, delayed recall and retention percentages across three other verbal and visual memory tasks were appropriate. Thus, this weakness is not cause for concern in isolation. Performance variability was further noted across recognition aspects of memory measures, with scores ranging from the well below average to above average normative ranges. Outside of these results, performances across all other assessed cognitive domains (i.e., processing speed, attention/concentration, executive functioning, receptive and expressive language, visuospatial abilities, and encoding aspects of memory) were well within appropriate normative ranges given estimated premorbid intellectual levels. Mr. Ronalee RedHartsell denied difficulties completing instrumental activities of daily living (ADLs) independently.  Specific to memory, Mr. Ronalee RedHartsell was able to learn novel verbal and visual information efficiently and, outside of a list learning task, he retained this knowledge after lengthy delays. There were some weaknesses across recognition performances. However, upon closer inspection of the list learning task, all of his false positive errors (11/12 hits, 6 false positive errors) were words previously  presented in list B, suggesting some discriminability concerns rather than evidence for the loss of information. Despite below average performance across a shape learning recognition task, his raw scores were still reasonable (7/9 hits, 1 false positive error). Overall, I do not believe that memory performance, combined with intact performances across other areas of cognitive functioning, is suggestive of early concerns for Alzheimer's disease at the present time. Likewise, his cognitive and behavioral profile is not suggestive of other forms of neurodegenerative illness such as Lewy body or frontotemporal dementia. Day-to-day cognitive difficulties/inefficiencies can be influenced by a variety of factors, including mild psychiatric distress (e.g., anxiety), various psychosocial stressors, and frequent headache symptoms. Continued medical monitoring will be important moving forward.   Recommendations: A repeat neuropsychological evaluation in 24-36 months (or sooner if functional decline is noted) is recommended to assess the trajectory of future cognitive decline should it occur. This will also aid in future efforts towards improved diagnostic clarity.  Mr. Ronalee RedHartsell is encouraged to attend to lifestyle factors for brain health (e.g., regular physical exercise, good nutrition habits, regular participation in cognitively-stimulating activities, and general stress management techniques), which are likely to have benefits for both emotional adjustment and cognition. In fact, in addition to promoting good general health, regular exercise incorporating aerobic activities (e.g., brisk walking, jogging, cycling, etc.) has been demonstrated to be a very effective treatment for depression and stress, with similar efficacy rates to both antidepressant medication and psychotherapy. Optimal control of vascular risk factors (including safe cardiovascular exercise and adherence to dietary recommendations) is encouraged.    If interested, there are some activities which have therapeutic value and can be useful in keeping him cognitively stimulated. For suggestions, Mr. Ronalee RedHartsell is encouraged to go to the following website: https://www.[facility].org/get-to-know-barrow/centers-programs/neurorehabilitation-center/neuro-rehab-apps-and-games/ which has options, categorized by level of difficulty. It should be noted that these activities should not be viewed as a substitute for therapy.  Memory can be improved using internal  strategies such as rehearsal, repetition, chunking, mnemonics, association, and imagery. External strategies such as written notes in a consistently used memory journal, visual and nonverbal auditory cues such as a calendar on the refrigerator or appointments with alarm, such as on a cell phone, can also help maximize recall.  He should continue to routinely use memory compensatory techniques. It is also recommended that he utilize multiple strategies when learning new information, such as repeating it, writing it down, and putting it into his own words to promote encoding through multi-modal learning.  To address problems with processing speed, he may wish to consider:   -Ensuring that he is alerted when essential material or instructions are being presented   -Adjusting the speed at which new information is presented   -Allowing for more time in comprehending, processing, and responding in conversation  To address problems with fluctuating attention, he may wish to consider:   -Avoiding external distractions when needing to concentrate   -Limiting exposure to fast paced environments with multiple sensory demands   -Writing down complicated information and using checklists   -Attempting and completing one task at a time (i.e., no multi-tasking)   -Verbalizing aloud each step of a task to maintain focus   -Reducing the amount of information considered at one time  Reducing anxiety may also aid in  the retrieval of information. Mr. Fly is encouraged to prepare scripts he can use socially when he experiences difficulty with word finding or memory. Such scripts should be brief explanations of the difficulty (e.g., "the word escapes me now") and allow him to move the conversation forward quickly rather than dwelling on the issue.  Review of Records:   Mr. Reeves was seen by [facility] Shon Millet, D.O.) on 07/16/2019 for follow-up of tension-type headaches. Briefly, onset of symptoms started in 2018. He was playing golf and while walking to his ball felt like he was going to pass out. On average, this sensation lasts for a brief moment and occurs on average 1 to 2 days a week. He also has headache symptoms which start in the back of his neck, radiating to the occipital region (rarely to the front) of moderate to severe intensity. Symptoms are primarily non-throbbing squeezing sensations which were occurring daily and would last all day. They have not been associated with nausea, vomiting, photophobia, phonophobia, osmophobia, autonomic symptoms, or unilateral numbness or weakness. He further reported episodes of left facial numbness and tinglingassociated with slowed speech. This also has notassociated with headache, nausea, vomiting, photophobia, phonophobia, osmophobia, visual disturbance, autonomic symptoms, or unilateral numbness or weakness. This will typically lasts less than 30 minutes and occur once per week.   Since 2020, he and his wife reported cognitive changes where he sometimes gets words mixed up while speaking.For example, he may say hot when he meant to say cold. Also, if he has 3 or 4 paragraphs to read, he has now found himself reading the last paragraph and then working up to the first paragraph. The reason for this is unknown. He denied difficulties understanding what he reads or what other people are saying to him and people do not seem to have difficulty  understanding what he is saying. Ultimately, Mr. Nutting was referred for a comprehensive neuropsychological evaluation to characterize his cognitive abilities and to assist with diagnostic clarity and treatment planning.  Brain MRI on 03/06/2019 was unremarkable.  Past Medical History:  Diagnosis Date  . Episodic tension-type headache, not intractable   . Esophageal reflux disease  on PPI  . Essential hypertension    on Lisinopril  . History of COVID-19 10/2018   Mild symptoms, no hospitalization  . History of PSVT (paroxysmal supraventricular tachycardia)    SVT ablation age 81  . Mixed hyperlipidemia    High cholesterol since age 35 - tried multiple statins and Zetia with intolerance A. 2019 TC 221, TG 202, HDL 47, LDL 134 - Lipitor 40mg  (1 week) - unable to tolerate B. 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 2019 NMR profile: TC 265, TG 148, HDL 58, LDL 177, LDL-P# 1953, small LDL-P# 2020, LDL size 20.7 - diet only -   . RBBB (right bundle branch block) 2019   Noted on ECG    No past surgical history on file.   Current Outpatient Medications:  .  cholecalciferol (VITAMIN D) 1000 units tablet, Take 1,000 Units by mouth daily., Disp: , Rfl:  .  fluticasone (FLONASE) 50 MCG/ACT nasal spray, , Disp: , Rfl:  .  lisinopril (PRINIVIL,ZESTRIL) 20 MG tablet, Take 20 mg by mouth daily., Disp: , Rfl: 3 .  Multiple Vitamins-Minerals (MULTIVITAMIN WITH MINERALS) tablet, Take 1 tablet by mouth daily., Disp: , Rfl:  .  nortriptyline (PAMELOR) 25 MG capsule, TAKE 1 CAPSULE BY MOUTH EVERY NIGHT AT BEDTIME, Disp: 30 capsule, Rfl: 3 .  omeprazole (PRILOSEC) 20 MG capsule, Take 20 mg by mouth daily., Disp: , Rfl:  .  REPATHA 140 MG/ML SOSY, Inject 140 mg into the skin every 30 (thirty) days., Disp: , Rfl:  .  tiZANidine (ZANAFLEX) 4 MG tablet, TAKE 1 TABLET(4 MG) BY MOUTH AT BEDTIME, Disp: 30 tablet, Rfl: 5  Clinical Interview:   Cognitive Symptoms: Decreased short-term memory:  Endorsed. He primarily described difficulties recalling the names of familiar individuals. This information was said to generally come to him about 15-20 seconds later. He denied difficulties remembering the details of previous conversations or misplacing things in his environment.  Decreased long-term memory: Denied. Decreased attention/concentration: Denied. Reduced processing speed: Denied. Difficulties with executive functions: Denied. Trouble with impulsivity or overt personality changes were also denied.  Difficulties with emotion regulation: Denied. Difficulties with receptive language: Denied. Difficulties with word finding: Endorsed. He described instances where he will inappropriately substitute a word he meant to say with something different. For example, when meaning to say "hot" he might accidentally say "cold" instead. This was said to occur fairly often and he finds himself correcting his speech often. He also noted some trouble with articulation, "almost like a stutter." Changes were said to have been noticeable over the past year and have seemed largely stable.  Decreased visuoperceptual ability: Denied.  Difficulties completing ADLs: Denied.  Additional Medical History: History of traumatic brain injury/concussion: Unclear. Approximately 12 years ago he hit his head on a piece of wood while running from a swarm of yellow jackets, causing him to fall to the ground. He was briefly hospitalized and had to get stapes in his head to close a wound. He denied a loss in consciousness. Persisting symptoms from this event were also denied.  History of stroke: Denied. History of seizure activity: Denied. History of known exposure to toxins: Denied. Symptoms of chronic pain: Denied. Experience of frequent headaches/migraines: Endorsed. He reported a prior experience of daily tension-type headaches starting in the back of his neck and radiating to the occipital region. Symptoms were of  moderate to severe intensity and could last all day. Medication intervention (nortriptyline) was said to be helpful at improving these  symptoms. They currently were said to occur once per week.  Frequent instances of dizziness/vertigo: Denied. He did report a prior history of vertigo symptoms (e.g., feeling like he is riding in an elevator) which occurred frequently and impacted his balance. However, these symptoms were said to have improved lately.   Sensory changes: He reported recently having his vision checked and that he was seeing 20/20. However, he continues to report occasional blurred vision in his left eye. Other sensory changes/difficulties (e.g., hearing, taste, or smell) were denied.  Balance/coordination difficulties: Denied outside of difficulties attributed to vertigo symptoms described above.  Other motor difficulties: Denied.  Sleep History: Estimated hours obtained each night: 7-8 hours.  Difficulties falling asleep: Denied. Difficulties staying asleep: Denied. Feels rested and refreshed upon awakening: Endorsed.  History of snoring: He stated that his wife would likely say that he does snore.  History of waking up gasping for air: Denied. Witnessed breath cessation while asleep: Denied.  History of vivid dreaming: Denied. Excessive movement while asleep: Denied. Instances of acting out his dreams: Denied.  Psychiatric/Behavioral Health History: Depression: Denied. He described his current mood as "fine" and denied prior mental health concerns. Current or remote suicidal ideation, intent, or plan was also denied.  Anxiety: Denied. Mania: Denied. Trauma History: Denied. Visual/auditory hallucinations: Denied. Delusional thoughts: Denied.  Tobacco: Denied. Alcohol: He reported social alcohol consumption. He denied a history of problematic alcohol abuse or dependence.  Recreational drugs: Denied. Caffeine: One cup of coffee in the morning.   Family History: Problem  Relation Age of Onset  . Bipolar disorder Mother   . Breast cancer Mother   . Dementia Mother    This information was confirmed by Mr. Spratley.  Academic/Vocational History: Highest level of educational attainment: 14 years. He graduated from high school and earned an Associate's degree. He described himself as an average (C) student in academic settings. Math represented a potential relative weakness.  History of developmental delay: Denied. History of grade repetition: Denied. Enrollment in special education courses: Denied. History of LD/ADHD: Denied.  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   Evaluation Results:   Behavioral Observations: Mr. Mormile was unaccompanied, arrived to his appointment on time, and was appropriately dressed and groomed. He appeared alert and oriented. Observed gait and station were within normal limits. Gross motor functioning appeared intact upon informal observation and no abnormal movements (e.g., tremors) were noted. His affect was generally relaxed and positive, but did range appropriately given the subject being discussed during the clinical interview or the task at hand during testing procedures. Spontaneous speech was fluent and word finding difficulties were not observed during the clinical interview. Thought processes were coherent, organized, and normal in content. Insight into his cognitive difficulties appeared adequate. During testing, sustained attention was appropriate. Task engagement was adequate and he persisted when challenged. Overall, Mr. Oelke was cooperative with the clinical interview and subsequent testing procedures.   Adequacy of Effort: The validity of neuropsychological testing is limited by the extent to which the individual being tested may be assumed to have exerted adequate effort during testing. Mr. Yu expressed his intention to perform to the best of his abilities and exhibited  adequate task engagement and persistence. Scores across stand-alone and embedded performance validity measures were within expectation. As such, the results of the current evaluation are believed to be a valid representation of Mr. Carmen current cognitive functioning.  Test Results: Mr. Eckert was fully oriented at the time  of the current evaluation.  Intellectual abilities based upon educational and vocational attainment were estimated to be in the average range. Premorbid abilities were estimated to be within the average range based upon a single-word reading test.   Processing speed was average to above average. Basic attention was above average. More complex attention (e.g., working memory) was average. Executive functioning was average to above average.  Assessed receptive language abilities were average. Likewise, Mr. Doyle did not exhibit any difficulties comprehending task instructions and answered all questions asked of him appropriately. Assessed expressive language (e.g., verbal fluency and confrontation naming) was average to above average.     Assessed visuospatial/visuoconstructional abilities were below average to average. Points were lost on his drawing of a complex figure due to a sloppy approach and poor attention to detail rather than frank visuospatial distortions.    Learning (i.e., encoding) of novel verbal and visual information was average to exceptionally high. Spontaneous delayed recall (i.e., retrieval) of previously learned information was largely average to above average. However, he did exhibit an isolated weakness recalling a list of previously learned words. Retention rates were 69% across a story learning task, 0% across a list learning task, and 140% across a shape learning task. Performance across recognition tasks was variable, ranging from the well below average to above average normative ranges, suggesting evidence for information consolidation.     Results of emotional screening instruments suggested that recent symptoms of generalized anxiety were in the mild range, while symptoms of depression were within normal limits. A screening instrument assessing recent sleep quality suggested the presence of minimal sleep dysfunction.  Tables of Scores:   Note: This summary of test scores accompanies the interpretive report and should not be considered in isolation without reference to the appropriate sections in the text. Descriptors are based on appropriate normative data and may be adjusted based on clinical judgment. The terms "impaired" and "within normal limits (WNL)" are used when a more specific level of functioning cannot be determined.       Effort Testing:   DESCRIPTOR       ACS Word Choice: --- --- Within Expectation  NAB EVI: --- --- Within Expectation  D-KEFS Color Word Effort Index: --- --- Within Expectation       Orientation:      Raw Score Percentile   NAB Orientation, Form 1 29/29 --- ---       Intellectual Functioning:           Standard Score Percentile   Test of Premorbid Functioning: 96 39 Average       Memory:          NAB Memory Module, Form 2: Standard Score/ T Score Percentile   Total Memory Index 99 47 Average  List Learning       Total Trials 1-3 27/36 (58) 79 Above Average    List B 6/12 (61) 86 Above Average    Short Delay Free Recall 7/12 (46) 34 Average    Long Delay Free Recall 0/12 (25) 1 Exceptionally Low    Retention Percentage 0 (7) <1 Exceptionally Low    Recognition Discriminability 5 (36) 8 Well Below Average  Shape Learning       Total Trials 1-3 14/27 (44) 27 Average    Delayed Recall 7/9 (59) 82 Above Average    Retention Percentage 140 (64) 92 Well Above Average    Recognition Discriminability 6 (40) 16 Below Average  Story Learning       Immediate  Recall 41/80 (45) 31 Average    Delayed Recall 20/40 (44) 27 Average    Retention Percentage 69 (34) 5 Well Below Average  Daily  Living Memory       Immediate Recall 50/51 (72) 99 Exceptionally High    Delayed Recall 16/17 (60) 84 Above Average    Retention Percentage 94 (53) 62 Average    Recognition Hits 10/10 (58) 79 Above Average       Attention/Executive Function:          Trail Making Test (TMT): Raw Score (T Score) Percentile     Part A 28 secs.,  0 errors (50) 50 Average    Part B 51 secs.,  1 error (57) 75 Above Average         Scaled Score Percentile   WAIS-IV Coding: 12 75 Above Average       NAB Attention Module, Form 1: T Score Percentile     Digits Forward 59 82 Above Average    Digits Backwards 56 73 Average       D-KEFS Color-Word Interference Test: Raw Score (Scaled Score) Percentile     Color Naming 27 secs. (11) 63 Average    Word Reading 22 secs. (11) 63 Average    Inhibition 53 secs. (12) 75 Above Average      Total Errors 2 errors (9) 37 Average    Inhibition/Switching 55 secs. (12) 75 Above Average      Total Errors 0 errors (12) 75 Above Average       D-KEFS Verbal Fluency Test: Raw Score (Scaled Score) Percentile     Letter Total Correct 43 (12) 75 Above Average    Category Total Correct 40 (11) 63 Average    Category Switching Total Correct 14 (11) 63 Average    Category Switching Accuracy 13 (11) 63 Average      Total Set Loss Errors 0 (13) 84 Above Average      Total Repetition Errors 6 (7) 16 Below Average       D-KEFS 20 Questions Test: Scaled Score Percentile     Total Weighted Achievement Score 12 75 Above Average    Initial Abstraction Score 9 37 Average       Wisconsin Card Sorting Test: Raw Score Percentile     Categories (trials) 3 (64) >16 Within Normal Limits    Total Errors 9 79 Above Average    Perseverative Errors 5 69 Average    Non-Perseverative Errors 4 54 Average    Failure to Maintain Set 2 --- ---       Language:          Verbal Fluency Test: Raw Score (T Score) Percentile     Phonemic Fluency (FAS) 43 (52) 58 Average    Animal Fluency 19  (48) 42 Average        NAB Language Module, Form 1: T Score Percentile     Auditory Comprehension 56 73 Average    Naming 31/31 (54) 66 Average       Visuospatial/Visuoconstruction:      Raw Score Percentile   Clock Drawing: 10/10 --- Within Normal Limits       NAB Spatial Module, Form 1: T Score Percentile     Figure Drawing Copy 38 12 Below Average    Figure Drawing Immediate Recall 38 12 Below Average        Scaled Score Percentile   WAIS-IV Block Design: 11 63 Average  WAIS-IV Matrix Reasoning: 11 63 Average  Social Perception:          ACS Social Cognition: Scaled Score Percentile     Affect Naming 15 95 Well Above Average       Mood and Personality:      Raw Score Percentile   Beck Depression Inventory - II: 10 --- Within Normal Limits  PROMIS Anxiety Questionnaire: 17 --- Mild       Additional Questionnaires:      Raw Score Percentile   PROMIS Sleep Disturbance Questionnaire: 15 --- None to Slight   Informed Consent and Coding/Compliance:   Mr. Hennes was provided with a verbal description of the nature and purpose of the present neuropsychological evaluation. Also reviewed were the foreseeable risks and/or discomforts and benefits of the procedure, limits of confidentiality, and mandatory reporting requirements of this provider. The patient was given the opportunity to ask questions and receive answers about the evaluation. Oral consent to participate was provided by the patient.   This evaluation was conducted by Newman Nickels, Ph.D., licensed clinical neuropsychologist. Mr. Harkins completed a comprehensive clinical interview with Dr. Milbert Coulter, billed as one unit [phone], and 145 minutes of cognitive testing and scoring, billed as one unit [phone] and four additional units [id number]. Psychometrist Wallace Keller, B.S., assisted Dr. Milbert Coulter with test administration and scoring procedures. As a separate and discrete service, Dr. Milbert Coulter spent a total of 120 minutes in  interpretation and report writing billed as one unit [phone] and one unit [phone].

## 2019-08-14 NOTE — Patient Instructions (Signed)
Clinical Impression(s): Barry Villarreal pattern of performance is suggestive of neuropsychological functioning largely within normal limits. An isolated weakness was exhibited recalling a previously learned list of words after a lengthy delay (retention percentage: 0%). However, delayed recall and retention percentages across three other verbal and visual memory tasks were appropriate. Thus, this weakness is not cause for concern in isolation. Performance variability was further noted across recognition aspects of memory measures, with scores ranging from the well below average to above average normative ranges. Outside of these results, performances across all other assessed cognitive domains (i.e., processing speed, attention/concentration, executive functioning, receptive and expressive language, visuospatial abilities, and encoding aspects of memory) were well within appropriate normative ranges given estimated premorbid intellectual levels. Barry Villarreal denied difficulties completing instrumental activities of daily living (ADLs) independently.  Specific to memory, Barry Villarreal was able to learn novel verbal and visual information efficiently and, outside of a list learning task, he retained this knowledge after lengthy delays. There were some weaknesses across recognition performances. However, upon closer inspection of the list learning task, all of his false positive errors (11/12 hits, 6 false positive errors) were words previously presented in list B, suggesting some discriminability concerns rather than evidence for the loss of information. Despite below average performance across a shape learning recognition task, his raw scores were still reasonable (7/9 hits, 1 false positive error). Overall, I do not believe that memory performance, combined with intact performances across other areas of cognitive functioning, is suggestive of early concerns for Alzheimer's disease at the present time. Likewise, his  cognitive and behavioral profile is not suggestive of other forms of neurodegenerative illness such as Lewy body or frontotemporal dementia. Day-to-day cognitive difficulties/inefficiencies can be influenced by a variety of factors, including mild psychiatric distress (e.g., anxiety), various psychosocial stressors, and frequent headache symptoms. Continued medical monitoring will be important moving forward.

## 2019-08-14 NOTE — Progress Notes (Signed)
   Psychometrician Note   Cognitive testing was administered to [employer] by Wallace Keller, B.S. (psychometrist) under the supervision of Dr. Newman Nickels, Ph.D., licensed psychologist on 08/14/19. Mr. Purtee did not appear overtly distressed by the testing session per behavioral observation or responses across self-report questionnaires. Dr. Newman Nickels, Ph.D. checked in with Mr. Utz as needed to manage any distress related to testing procedures (if applicable). Rest breaks were offered.    The battery of tests administered was selected by Dr. Newman Nickels, Ph.D. with consideration to Mr. Mccahill current level of functioning, the nature of his symptoms, emotional and behavioral responses during interview, level of literacy, observed level of motivation/effort, and the nature of the referral question. This battery was communicated to the psychometrist. Communication between Dr. Newman Nickels, Ph.D. and the psychometrist was ongoing throughout the evaluation and Dr. Newman Nickels, Ph.D. was immediately accessible at all times. Dr. Newman Nickels, Ph.D. provided supervision to the psychometrist on the date of this service to the extent necessary to assure the quality of all services provided.    Cutberto Winfree will return within approximately 1-2 weeks for an interactive feedback session with Dr. Milbert Coulter at which time his test performances, clinical impressions, and treatment recommendations will be reviewed in detail. Mr. Kleve understands he can contact our office should he require our assistance before this time.  A total of 145 minutes of billable time were spent face-to-face with Mr. Sangiovanni by the psychometrist. This includes both test administration and scoring time. Billing for these services is reflected in the clinical report generated by Dr. Newman Nickels, Ph.D..  This note reflects time spent with the psychometrician and does not include test scores or any  clinical interpretations made by Dr. Milbert Coulter. The full report will follow in a separate note.

## 2019-08-25 ENCOUNTER — Ambulatory Visit (INDEPENDENT_AMBULATORY_CARE_PROVIDER_SITE_OTHER): Payer: 59 | Admitting: Psychology

## 2019-08-25 ENCOUNTER — Other Ambulatory Visit: Payer: Self-pay

## 2019-08-25 DIAGNOSIS — G44219 Episodic tension-type headache, not intractable: Secondary | ICD-10-CM

## 2019-08-25 DIAGNOSIS — R4189 Other symptoms and signs involving cognitive functions and awareness: Secondary | ICD-10-CM

## 2019-08-25 NOTE — Progress Notes (Signed)
   Neuropsychology Feedback Session Barry Villarreal. [facility] [place] Department of Neurology  Reason for Referral:   Barry Villarreal a 54 y.o. right-handed Caucasian male referred by Shon Millet, D.O.,to characterize hiscurrent cognitive functioning and assist with diagnostic clarity and treatment planning in the context of subjective cognitive decline primarily surrounding expressive language.   Feedback:   Barry Villarreal completed a comprehensive neuropsychological evaluation on 08/14/2019. Please refer to that encounter for the full report and recommendations. Briefly, results suggested neuropsychological functioning largely within normal limits. An isolated weakness was exhibited recalling a previously learned list of words after a lengthy delay (retention percentage: 0%). However, delayed recall and retention percentages across three other verbal and visual memory tasks were appropriate. Thus, this weakness is not cause for concern in isolation. Outside of these results, performances across all other assessed cognitive domains were well within appropriate normative ranges given estimated premorbid intellectual levels. Specific to memory, Barry Villarreal was able to learn novel verbal and visual information efficiently and, outside of a list learning task, he retained this knowledge after lengthy delays. There were some weaknesses across recognition performances. However, upon closer inspection of the list learning task, all of his false positive errors (11/12 hits, 6 false positive errors) were words previously presented in list B, suggesting some discriminability concerns rather than evidence for the loss of information. Despite below average performance across a shape learning recognition task, his raw scores were still reasonable (7/9 hits, 1 false positive error). Overall, I do not believe that memory performance, combined with intact performances across other areas of cognitive  functioning, is suggestive of early concerns for Alzheimer's disease at the present time. Likewise, his cognitive and behavioral profile is not suggestive of other forms of neurodegenerative illness such as Lewy body or frontotemporal dementia.  Barry Villarreal was unaccompanied during the current telephone call. He was within his residence while I was within my office. Content of the current session focused on the results of his neuropsychological evaluation. Barry Villarreal was given the opportunity to ask questions and his questions were answered. He was encouraged to reach out should additional questions arise. A copy of his report was mailed following the finalization of his report.      Less than 16 minutes were spent conducting the current feedback session with Barry Villarreal.

## 2019-08-25 NOTE — Patient Instructions (Signed)
Recommendations: A repeat neuropsychological evaluation in 24-36 months (or sooner if functional decline is noted) is recommended to assess the trajectory of future cognitive decline should it occur. This will also aid in future efforts towards improved diagnostic clarity.  Barry Villarreal is encouraged to attend to lifestyle factors for brain health (e.g., regular physical exercise, good nutrition habits, regular participation in cognitively-stimulating activities, and general stress management techniques), which are likely to have benefits for both emotional adjustment and cognition. In fact, in addition to promoting good general health, regular exercise incorporating aerobic activities (e.g., brisk walking, jogging, cycling, etc.) has been demonstrated to be a very effective treatment for depression and stress, with similar efficacy rates to both antidepressant medication and psychotherapy. Optimal control of vascular risk factors (including safe cardiovascular exercise and adherence to dietary recommendations) is encouraged.   If interested, there are some activities which have therapeutic value and can be useful in keeping him cognitively stimulated. For suggestions, Barry Villarreal is encouraged to go to the following website: https://www.[facility].org/get-to-know-barrow/centers-programs/neurorehabilitation-center/neuro-rehab-apps-and-games/ which has options, categorized by level of difficulty. It should be noted that these activities should not be viewed as a substitute for therapy.  Memory can be improved using internal strategies such as rehearsal, repetition, chunking, mnemonics, association, and imagery. External strategies such as written notes in a consistently used memory journal, visual and nonverbal auditory cues such as a calendar on the refrigerator or appointments with alarm, such as on a cell phone, can also help maximize recall.  He should continue to routinely use memory compensatory  techniques. It is also recommended that he utilize multiple strategies when learning new information, such as repeating it, writing it down, and putting it into his own words to promote encoding through multi-modal learning.  To address problems with processing speed, he may wish to consider:   -Ensuring that he is alerted when essential material or instructions are being presented   -Adjusting the speed at which new information is presented   -Allowing for more time in comprehending, processing, and responding in conversation  To address problems with fluctuating attention, he may wish to consider:   -Avoiding external distractions when needing to concentrate   -Limiting exposure to fast paced environments with multiple sensory demands   -Writing down complicated information and using checklists   -Attempting and completing one task at a time (i.e., no multi-tasking)   -Verbalizing aloud each step of a task to maintain focus   -Reducing the amount of information considered at one time  Reducing anxiety may also aid in the retrieval of information. Barry Villarreal is encouraged to prepare scripts he can use socially when he experiences difficulty with word finding or memory. Such scripts should be brief explanations of the difficulty (e.g., "the word escapes me now") and allow him to move the conversation forward quickly rather than dwelling on the issue.

## 2019-12-17 ENCOUNTER — Other Ambulatory Visit: Payer: Self-pay | Admitting: Neurology

## 2019-12-30 ENCOUNTER — Other Ambulatory Visit: Payer: Self-pay | Admitting: Neurology

## 2020-03-23 NOTE — Progress Notes (Signed)
Virtual Visit via Video Note The purpose of this virtual visit is to provide medical care while limiting exposure to the novel coronavirus.    Consent was obtained for video visit:  Yes.   Answered questions that patient had about telehealth interaction:  Yes.   I discussed the limitations, risks, security and privacy concerns of performing an evaluation and management service by telemedicine. I also discussed with the patient that there may be a patient responsible charge related to this service. The patient expressed understanding and agreed to proceed.  Pt location: Home Physician Location: office Name of referring provider:  Loleta Chance, MD I connected with Barry Villarreal at patients initiation/request on 03/25/2020 at  8:50 AM EST by video enabled telemedicine application and verified that I am speaking with the correct person using two identifiers. Pt MRN:  [id number] Pt DOB:  02/25/1965 Video Participants:  Barry Villarreal   Assessment/Plan:   1.  Tension-type headache, not intractable   1.  Nortriptyline 25mg  at bedtime 2.  Limit use of pain relievers to no more than 2 days out of week to prevent risk of rebound or medication-overuse headache. 3.  Keep headache diary 4.  Follow up one year  Subjective:  Barry Villarreal is a 55 year old right-handed male who follows up for tension-type headache.  UPDATE: For further evaluation of speech/cognitive changes, underwent neuropsychological evaluation on 08/14/2019 which was within normal limits.    Limiting caffeine intake and tried acupuncture for neck pain which has helped. 2 headaches in February.   Current NSAIDS:Aleve Current analgesics:Excedrin Current triptans:none Current ergotamine:none Current anti-emetic:none Current muscle relaxants:Tizanidine4mg  in evening Current anti-anxiolytic:none Current sleep aide:none Current Antihypertensive medications:lisinopril Current Antidepressant  medications:Nortriptyline25mg  at bedtime Current Anticonvulsant medications:none Current anti-CGRP:none Current Vitamins/Herbal/Supplements:D Current Antihistamines/Decongestants:none Other therapy:none  Diet:hydrates Exercise:Routine Depression:No; Anxiety:No Other pain:No Sleep hygiene:Good  HISTORY: Onset of symptoms started in September 2018.He was playing golf and while walking to his ball, he suddenly felt like he was going to pass out. On average, it lasts for a brief moment and occurs on average 1 to 2 days a week.   He also has a headache starting in the back of his neck radiating to the occipital region, rarely to the front, moderate to severe intensity, non-throbbing squeezing. They last all day and are daily.They are not associated with nausea, vomiting, photophobia, phonophobia, osmophobia, autonomic symptoms or unilateral numbness or weakness.They are not aggravated by anything. Aleve or Excedrin tension sometimes helps. He has previously tried massage therapy and seen a chiropractor but uncertain if helpful because he was preoccupied by the headaches at the time.  He also reports episodes of left facial numbness and tinglingassociated with slowed speech. It is notassociated with headache, nausea, vomiting, photophobia, phonophobia, osmophobia, visual disturbance, autonomic symptoms or unilateral numbness or weaknessIt typically lasts less than 30 minutes. It typically occurs once a week.   Since 2020, he and his wife noted some changes. He sometimes gets words mixed up. For example, he may say hot when he meant to say cold. Also, if he has 3 or 4 paragraphs to read, he has now found himself reading the last paragraph and then working up to the first paragraph. He does not know why. He doesn't have any difficulty understanding what he reads or what other people are saying to him. People do not have difficulty understanding what  he is saying.  To evaluate for cause of language disorder, MRI of brain without contrast on 03/05/2019 was performed, which was normal.  He denies shooting pain down the left arm, but feels his left arm may be weak.  He denies history of headaches and migraines.  Caffeine: 2 cups coffee daily. For a period of time, he stopped and nothing changed. Exercise: routine Depression: No; Anxiety: No Sleep: Good. No family history of headaches or other neurologic conditions.  Past Medical History: Past Medical History:  Diagnosis Date  . Episodic tension-type headache, not intractable   . Esophageal reflux disease    on PPI  . Essential hypertension    on Lisinopril  . History of COVID-19 10/2018   Mild symptoms, no hospitalization  . History of PSVT (paroxysmal supraventricular tachycardia)    SVT ablation age 59  . Mixed hyperlipidemia    High cholesterol since age 35 - tried multiple statins and Zetia with intolerance A. 2019 TC 221, TG 202, HDL 47, LDL 134 - Lipitor 40mg  (1 week) - unable to tolerate B. 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 2019 NMR profile: TC 265, TG 148, HDL 58, LDL 177, LDL-P# 1953, small LDL-P# 2020, LDL size 20.7 - diet only -   . RBBB (right bundle branch block) 2019   Noted on ECG    Medications: Outpatient Encounter Medications as of 03/25/2020  Medication Sig  . cholecalciferol (VITAMIN D) 1000 units tablet Take 1,000 Units by mouth daily.  . fluticasone (FLONASE) 50 MCG/ACT nasal spray   . lisinopril (PRINIVIL,ZESTRIL) 20 MG tablet Take 20 mg by mouth daily.  . Multiple Vitamins-Minerals (MULTIVITAMIN WITH MINERALS) tablet Take 1 tablet by mouth daily.  . nortriptyline (PAMELOR) 25 MG capsule TAKE 1 CAPSULE BY MOUTH EVERY NIGHT AT BEDTIME  . omeprazole (PRILOSEC) 20 MG capsule Take 20 mg by mouth daily.  05/25/2020 REPATHA 140 MG/ML SOSY Inject 140 mg into the skin every 30 (thirty) days.  Marland Kitchen tiZANidine (ZANAFLEX) 4 MG tablet TAKE 1  TABLET(4 MG) BY MOUTH AT BEDTIME   No facility-administered encounter medications on file as of 03/25/2020.    Allergies: Allergies  Allergen Reactions  . Penicillins Hives    Family History: Family History  Problem Relation Age of Onset  . Bipolar disorder Mother   . Breast cancer Mother   . Dementia Mother     Observations/Objective:   There were no vitals taken for this visit. No acute distress.  Alert and oriented.  Speech fluent and not dysarthric.     Follow Up Instructions:    -I discussed the assessment and treatment plan with the patient. The patient was provided an opportunity to ask questions and all were answered. The patient agreed with the plan and demonstrated an understanding of the instructions.   The patient was advised to call back or seek an in-person evaluation if the symptoms worsen or if the condition fails to improve as anticipated.    05/25/2020, DO  Cira Servant Barry Villarreal

## 2020-03-25 ENCOUNTER — Telehealth (INDEPENDENT_AMBULATORY_CARE_PROVIDER_SITE_OTHER): Payer: 59 | Admitting: Neurology

## 2020-03-25 ENCOUNTER — Other Ambulatory Visit: Payer: Self-pay

## 2020-03-25 ENCOUNTER — Encounter: Payer: Self-pay | Admitting: Neurology

## 2020-03-25 DIAGNOSIS — G44219 Episodic tension-type headache, not intractable: Secondary | ICD-10-CM

## 2020-04-27 ENCOUNTER — Other Ambulatory Visit: Payer: Self-pay | Admitting: Neurology

## 2021-03-15 IMAGING — MR MR HEAD W/O CM
10 series · 48 of 48 positions shown · non-contrast
Comparison: None.

CLINICAL DATA: Left facial numbness.  Language difficulty

EXAM:
MRI HEAD WITHOUT CONTRAST
TECHNIQUE: Multiplanar, multiecho pulse sequences of the brain and surrounding
structures were obtained without intravenous contrast.

[Series 2: T1 · sagittal · 5.0mm · 0.47mm/px · 1 of 26 slices shown]
[im 1/26]
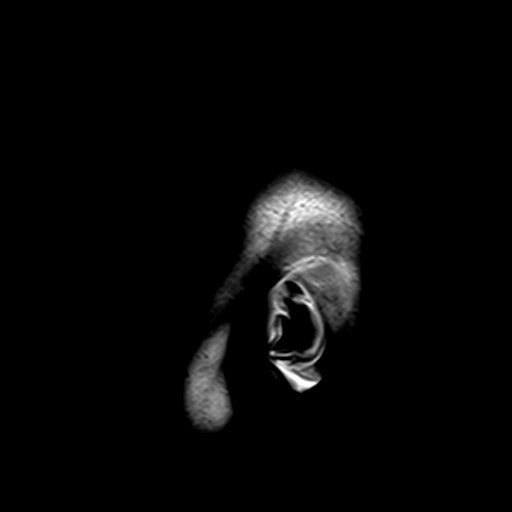

[Series 3: DWI · axial · 3.0mm · 1.88mm/px · z∈[-60,+99]mm · 9 of 108 slices shown (1 of 4)]
[im 1/108]
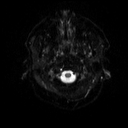
[im 14/108]
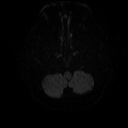
[im 27/108]
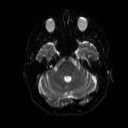
[im 41/108]
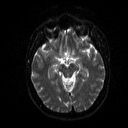
[im 54/108]
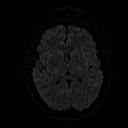
[im 67/108]
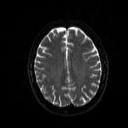
[im 81/108]
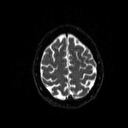
[im 94/108]
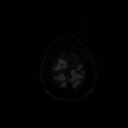
[im 108/108]
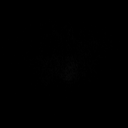

[Series 4: DWI · axial · 3.0mm · 1.88mm/px · z∈[-60,+99]mm · 4 of 52 slices shown (2 of 4)]
[im 1/52]
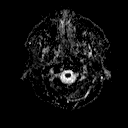
[im 18/52]
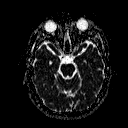
[im 35/52]
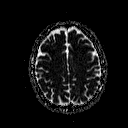
[im 52/52]
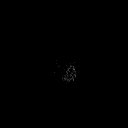

[Series 5: FLAIR · axial · 3.0mm · 0.47mm/px · z∈[-61,+97]mm · 3 of 35 slices shown]
[im 1/35]
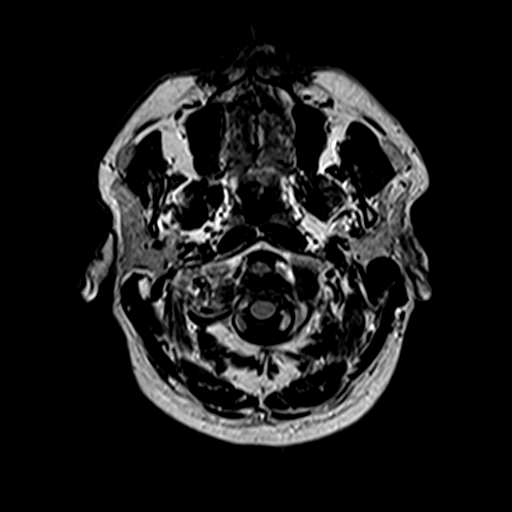
[im 18/35]
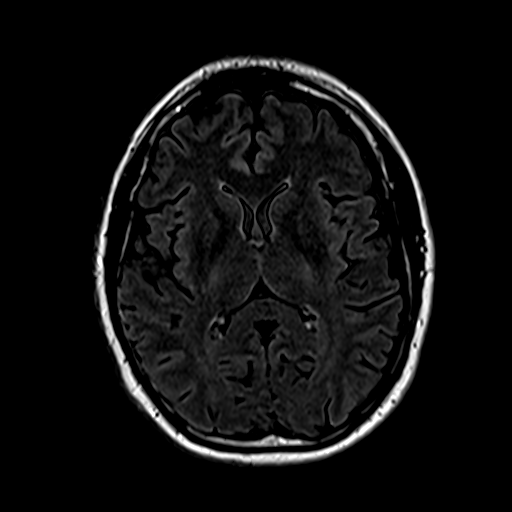
[im 35/35]
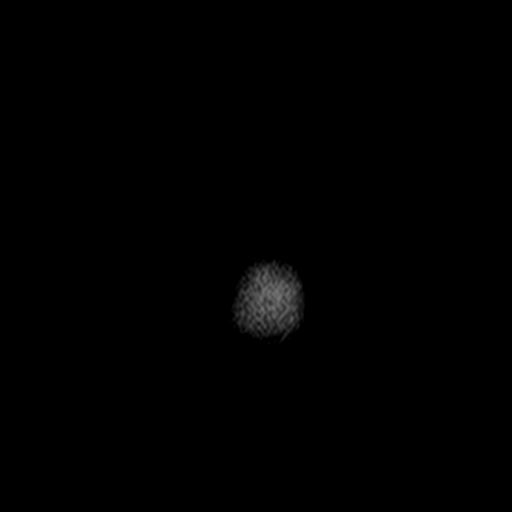

[Series 6: T2 · axial · 5.0mm · 0.62mm/px · z∈[-61,+100]mm · 2 of 25 slices shown (1 of 2)]
[im 1/25]
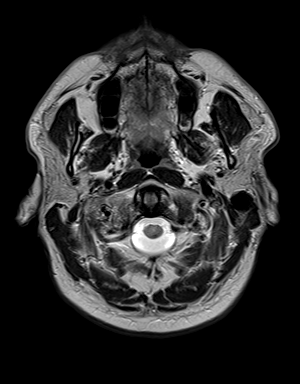
[im 25/25]
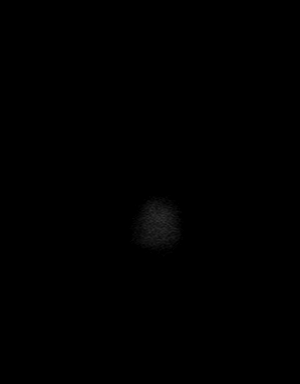

[Series 8: swi_images · axial · 5.0mm · 0.94mm/px · z∈[-60,+95]mm · 3 of 32 slices shown]
[im 1/32]
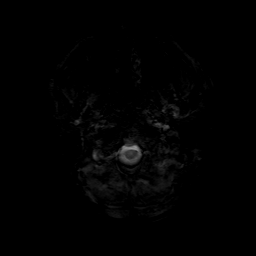
[im 16/32]
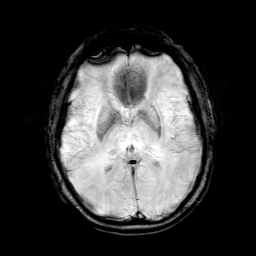
[im 32/32]
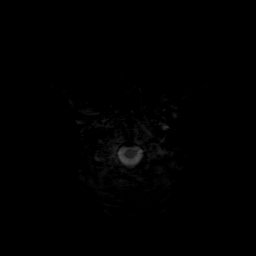

[Series 9: t1_mpr_tra · axial · 1.0mm · 0.75mm/px · z∈[-60,+99]mm · 13 of 160 slices shown]
[im 1/160]
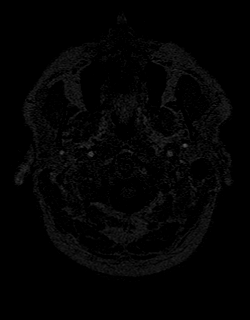
[im 14/160]
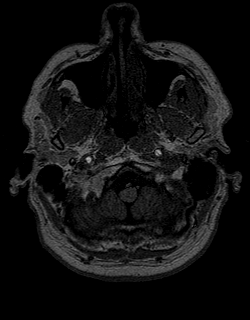
[im 27/160]
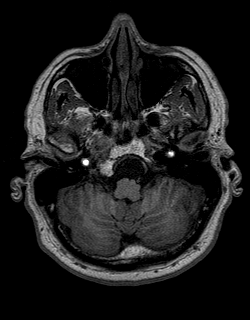
[im 40/160]
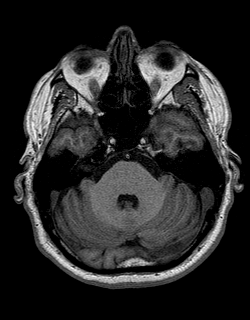
[im 54/160]
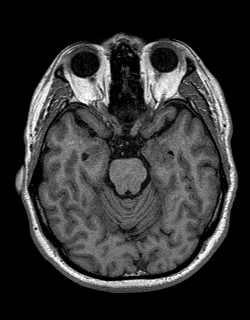
[im 67/160]
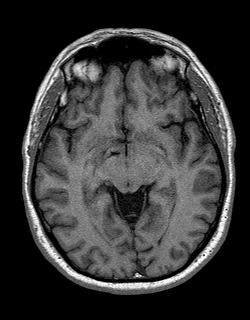
[im 80/160]
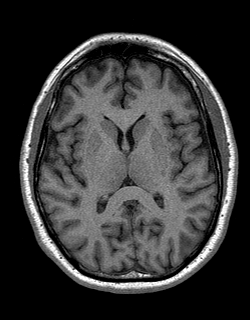
[im 93/160]
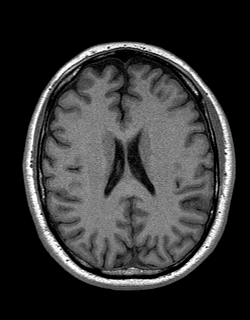
[im 107/160]
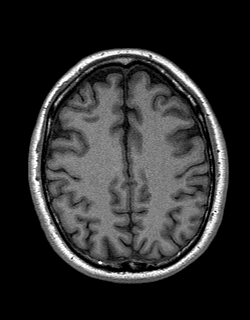
[im 120/160]
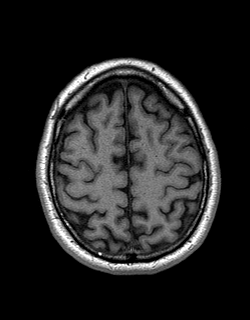
[im 133/160]
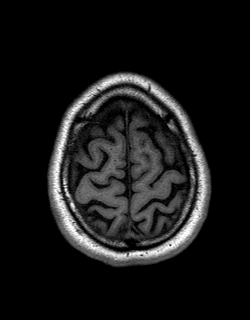
[im 146/160]
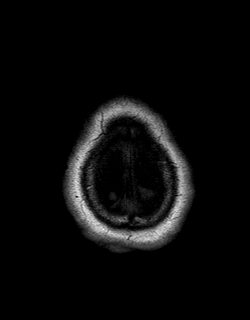
[im 160/160]
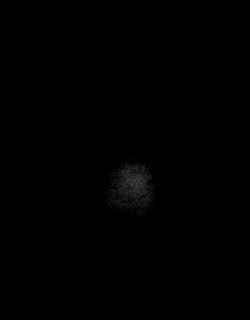

[Series 10: DWI · coronal · 5.0mm · 1.80mm/px · 7 of 82 slices shown (3 of 4)]
[im 1/82]
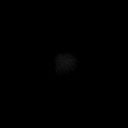
[im 14/82]
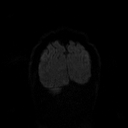
[im 28/82]
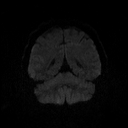
[im 41/82]
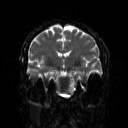
[im 55/82]
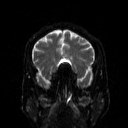
[im 68/82]
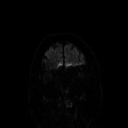
[im 82/82]
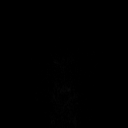

[Series 11: DWI · coronal · 5.0mm · 1.80mm/px · 3 of 41 slices shown (4 of 4)]
[im 1/41]
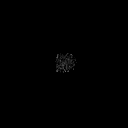
[im 21/41]
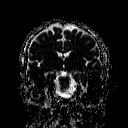
[im 41/41]
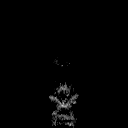

[Series 12: T2 · coronal · 5.0mm · 0.45mm/px · 3 of 32 slices shown (2 of 2)]
[im 1/32]
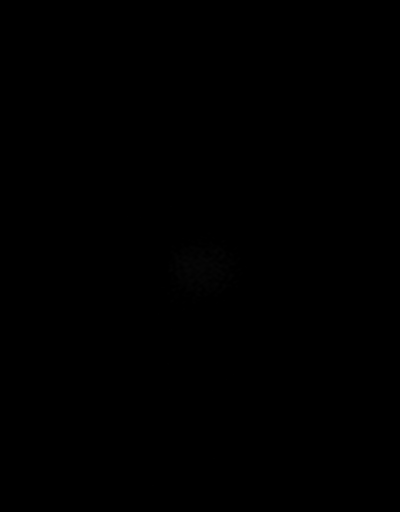
[im 16/32]
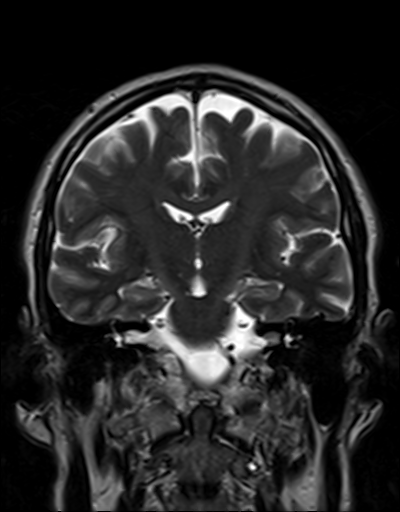
[im 32/32]
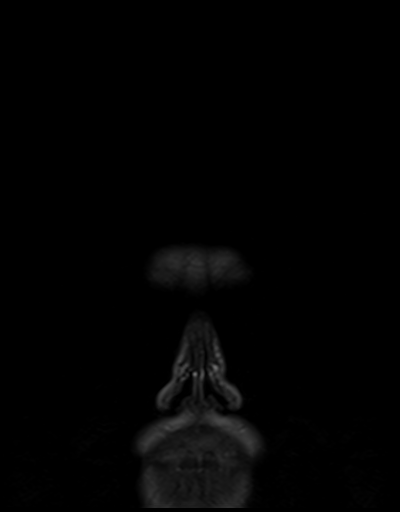

[48 of 48 positions shown; findings below may reference images not displayed]

FINDINGS: Brain: No acute infarction, hemorrhage, hydrocephalus, extra-axial
collection or mass lesion. Normal white matter.

Vascular: Normal arterial flow voids.

Skull and upper cervical spine: No focal skeletal lesion.

Sinuses/Orbits: Negative

Other: None
IMPRESSION: Negative MRI head without contrast.

## 2021-03-25 ENCOUNTER — Ambulatory Visit: Payer: 59 | Admitting: Neurology

## 2021-04-19 ENCOUNTER — Other Ambulatory Visit: Payer: Self-pay | Admitting: Neurology

## 2021-04-19 NOTE — Telephone Encounter (Signed)
Patient has follow up with Dr. Everlena Cooper, no further refills until seen ?

## 2021-05-02 NOTE — Progress Notes (Signed)
? ?Virtual Visit via Video Note ?The purpose of this virtual visit is to provide medical care while limiting exposure to the novel coronavirus.   ? ?Consent was obtained for video visit:  Yes.   ?Answered questions that patient had about telehealth interaction:  Yes.   ?I discussed the limitations, risks, security and privacy concerns of performing an evaluation and management service by telemedicine. I also discussed with the patient that there may be a patient responsible charge related to this service. The patient expressed understanding and agreed to proceed. ? ?Pt location: Home ?Physician Location: office ?Name of referring provider:  Loleta Chance, MD ?I connected with Barry Villarreal at patients initiation/request on 05/03/2021 at  9:30 AM EDT by video enabled telemedicine application and verified that I am speaking with the correct person using two identifiers. ?Pt MRN:  [id number] ?Pt DOB:  12/29/1965 ?Video Participants:  Barry Villarreal ? ?Assessment/Plan:  ?  ?1.  Tension-type headache, not intractable ?  ?  ?1.  Nortriptyline 25mg  at bedtime ?2.  Limit use of pain relievers to no more than 2 days out of week to prevent risk of rebound or medication-overuse headache. ?3.  Keep headache diary ?4.  Follow up one year ?  ?Subjective:  ?Barry Villarreal is a 56 year old right-handed male who follows up for tension-type headache. ?  ?UPDATE: ?2 headaches a month   ?Current NSAIDS: Aleve ?Current analgesics: Excedrin ?Current triptans:  none ?Current ergotamine:  none ?Current anti-emetic:  none ?Current muscle relaxants: Tizanidine 4 mg in evening ?Current anti-anxiolytic:  none ?Current sleep aide:  none ?Current Antihypertensive medications:  lisinopril ?Current Antidepressant medications: Nortriptyline 25 mg at bedtime ?Current Anticonvulsant medications:  none ?Current anti-CGRP:  none ?Current Vitamins/Herbal/Supplements:  D ?Current Antihistamines/Decongestants:  none ?Other therapy:  none ?   ? ?Diet:  hydrates ?Exercise: Routine ?Depression: No; Anxiety: No ?Other pain: No ?Sleep hygiene: Good ?  ?HISTORY: ? Onset of symptoms started in September 2018. He was playing golf and while walking to his ball, he suddenly felt like he was going to pass out.  On average, it lasts for a brief moment and occurs on average 1 to 2 days a week.   ?  ?He also has a headache starting in the back of his neck radiating to the occipital region, rarely to the front, moderate to severe intensity, non-throbbing squeezing.  They last all day and are daily.      They are not associated with nausea, vomiting, photophobia, phonophobia, osmophobia, autonomic symptoms or unilateral numbness or weakness.    They are not aggravated by anything.  Aleve or Excedrin tension sometimes helps.  He has previously tried massage therapy and seen a chiropractor but uncertain if helpful because he was preoccupied by the headaches at the time. ?  ?He also reports episodes of left facial numbness and tingling associated with slowed speech.  It is not associated with headache, nausea, vomiting, photophobia, phonophobia, osmophobia, visual disturbance, autonomic symptoms or unilateral numbness or weakness  It typically lasts less than 30 minutes.  It typically occurs once a week.   ?  ?Since 2020, he and his wife noted some changes.  He sometimes gets words mixed up.  For example, he may say hot when he meant to say cold.  Also, if he has 3 or 4 paragraphs to read, he has now found himself reading the last paragraph and then working up to the first paragraph.  He does not know why.  He doesn't have  any difficulty understanding what he reads or what other people are saying to him.  People do not have difficulty understanding what he is saying.  To evaluate for cause of language disorder, MRI of brain without contrast on 03/05/2019 was performed, which was normal.  For further evaluation of speech/cognitive changes, underwent neuropsychological  evaluation on 08/14/2019 which was within normal limits.   ?  ?  ?He denies shooting pain down the left arm, but feels his left arm may be weak. ?  ?He denies history of headaches and migraines.   ?Caffeine:  2 cups coffee daily.  For a period of time, he stopped and nothing changed. ?Exercise:  routine ?Depression:  No; Anxiety:  No ?Sleep:  Good. ?No family history of headaches or other neurologic conditions. ? ?Past Medical History: ?Past Medical History:  ?Diagnosis Date  ? Episodic tension-type headache, not intractable   ? Esophageal reflux disease   ? on PPI  ? Essential hypertension   ? on Lisinopril  ? History of COVID-19 10/2018  ? Mild symptoms, no hospitalization  ? History of PSVT (paroxysmal supraventricular tachycardia)   ? SVT ablation age 9  ? Mixed hyperlipidemia   ? High cholesterol since age 56 - tried multiple statins and Zetia with intolerance A. 2019 TC 221, TG 202, HDL 47, LDL 134 - Lipitor 40mg  (1 week) - unable to tolerate B. 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 2019 NMR profile: TC 265, TG 148, HDL 58, LDL 177, LDL-P# 1953, small LDL-P# 2020, LDL size 20.7 - diet only -   ? RBBB (right bundle branch block) 2019  ? Noted on ECG  ? ? ?Medications: ?Outpatient Encounter Medications as of 05/03/2021  ?Medication Sig  ? cholecalciferol (VITAMIN D) 1000 units tablet Take 1,000 Units by mouth daily.  ? fluticasone (FLONASE) 50 MCG/ACT nasal spray   ? lisinopril (PRINIVIL,ZESTRIL) 20 MG tablet Take 20 mg by mouth daily.  ? Multiple Vitamins-Minerals (MULTIVITAMIN WITH MINERALS) tablet Take 1 tablet by mouth daily.  ? nortriptyline (PAMELOR) 25 MG capsule TAKE 1 CAPSULE BY MOUTH EVERY NIGHT AT BEDTIME  ? omeprazole (PRILOSEC) 20 MG capsule Take 20 mg by mouth daily.  ? REPATHA 140 MG/ML SOSY Inject 140 mg into the skin every 30 (thirty) days.  ? tiZANidine (ZANAFLEX) 4 MG tablet TAKE 1 TABLET(4 MG) BY MOUTH AT BEDTIME  ? ?No facility-administered encounter medications on file as  of 05/03/2021.  ? ? ?Allergies: ?Allergies  ?Allergen Reactions  ? Penicillins Hives  ? ? ?Family History: ?Family History  ?Problem Relation Age of Onset  ? Bipolar disorder Mother   ? Breast cancer Mother   ? Dementia Mother   ? ? ?Observations/Objective:  ?No acute distress.  Alert and oriented.  Speech fluent and not dysarthric.  Language intact.   ? ? ?Follow Up Instructions: ?  ? -I discussed the assessment and treatment plan with the patient. The patient was provided an opportunity to ask questions and all were answered. The patient agreed with the plan and demonstrated an understanding of the instructions. ?  ?The patient was advised to call back or seek an in-person evaluation if the symptoms worsen or if the condition fails to improve as anticipated. ? ? ?05/05/2021, DO ? ?

## 2021-05-03 ENCOUNTER — Telehealth (INDEPENDENT_AMBULATORY_CARE_PROVIDER_SITE_OTHER): Payer: 59 | Admitting: Neurology

## 2021-05-03 ENCOUNTER — Encounter: Payer: Self-pay | Admitting: Neurology

## 2021-05-03 DIAGNOSIS — G44219 Episodic tension-type headache, not intractable: Secondary | ICD-10-CM | POA: Diagnosis not present

## 2021-05-19 ENCOUNTER — Other Ambulatory Visit: Payer: Self-pay | Admitting: Neurology

## 2021-11-02 ENCOUNTER — Encounter: Payer: Self-pay | Admitting: Neurology

## 2021-11-07 ENCOUNTER — Other Ambulatory Visit: Payer: Self-pay

## 2021-11-07 MED ORDER — NORTRIPTYLINE HCL 10 MG PO CAPS
10.0000 mg | ORAL_CAPSULE | Freq: Every day | ORAL | 1 refills | Status: DC
Start: 2021-11-07 — End: 2022-04-25

## 2021-11-15 ENCOUNTER — Other Ambulatory Visit: Payer: Self-pay | Admitting: Neurology

## 2022-04-25 ENCOUNTER — Other Ambulatory Visit: Payer: Self-pay | Admitting: Neurology

## 2022-05-04 ENCOUNTER — Ambulatory Visit: Payer: 59 | Admitting: Neurology
